# Patient Record
Sex: Female | Born: 1995 | Race: White | Hispanic: No | Marital: Single | Smoking: Never smoker
Health system: Southern US, Community
[De-identification: ages and names within clinical notes are randomized; demographics above are authoritative.]

---

## 2016-08-28 ENCOUNTER — Emergency Department
Admission: EM | Admit: 2016-08-28 | Discharge: 2016-08-28 | Disposition: A | Discharge status: Home / Self Care | Payer: BLUE CROSS/BLUE SHIELD | Attending: Emergency Medicine | Admitting: Emergency Medicine

## 2016-08-28 DIAGNOSIS — S80869A Insect bite (nonvenomous), unspecified lower leg, initial encounter: Secondary | ICD-10-CM | POA: Diagnosis present

## 2016-08-28 DIAGNOSIS — W57XXXA Bitten or stung by nonvenomous insect and other nonvenomous arthropods, initial encounter: Secondary | ICD-10-CM | POA: Insufficient documentation

## 2016-08-28 DIAGNOSIS — Y92821 Forest as the place of occurrence of the external cause: Secondary | ICD-10-CM | POA: Diagnosis not present

## 2016-08-28 DIAGNOSIS — F329 Major depressive disorder, single episode, unspecified: Secondary | ICD-10-CM

## 2016-08-28 DIAGNOSIS — Y998 Other external cause status: Secondary | ICD-10-CM | POA: Diagnosis not present

## 2016-08-28 DIAGNOSIS — F3289 Other specified depressive episodes: Secondary | ICD-10-CM | POA: Diagnosis not present

## 2016-08-28 DIAGNOSIS — Y9301 Activity, walking, marching and hiking: Secondary | ICD-10-CM | POA: Diagnosis not present

## 2016-08-28 DIAGNOSIS — F32A Depression, unspecified: Secondary | ICD-10-CM
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
## 2016-08-28 NOTE — ED Triage Notes (Signed)
Reports felt bite approximately 30 minutes prior to arrival on back of left leg and concerned might be a spider bite.

## 2016-08-28 NOTE — ED Notes (Signed)
Report to Susan, RN  

## 2016-08-28 NOTE — ED Notes (Signed)
Patient c/o 2 insect bites to lower left leg/posterior calf, and headache.  Pt is unsure if they are spider bites. Pt reports hx of reaction to spider bite in past.
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
## 2016-09-20 ENCOUNTER — Encounter: Payer: Self-pay | Admitting: Emergency Medicine

## 2016-09-20 ENCOUNTER — Emergency Department
Admission: EM | Admit: 2016-09-20 | Discharge: 2016-09-21 | Disposition: A | Discharge status: Other Acute Inpatient Hospital | Payer: BLUE CROSS/BLUE SHIELD | Attending: Emergency Medicine | Admitting: Emergency Medicine

## 2016-09-20 DIAGNOSIS — Z23 Encounter for immunization: Secondary | ICD-10-CM | POA: Insufficient documentation

## 2016-09-20 DIAGNOSIS — Z046 Encounter for general psychiatric examination, requested by authority: Secondary | ICD-10-CM | POA: Diagnosis present

## 2016-09-20 DIAGNOSIS — F22 Delusional disorders: Secondary | ICD-10-CM | POA: Insufficient documentation

## 2016-09-20 LAB — URINALYSIS, COMPLETE (UACMP) WITH MICROSCOPIC
Bilirubin Urine: NEGATIVE
GLUCOSE, UA: NEGATIVE mg/dL
Hgb urine dipstick: NEGATIVE
KETONES UR: 5 mg/dL — AB
NITRITE: NEGATIVE
PH: 5 (ref 5.0–8.0)
Protein, ur: NEGATIVE mg/dL
Specific Gravity, Urine: 1.008 (ref 1.005–1.030)

## 2016-09-20 LAB — COMPREHENSIVE METABOLIC PANEL
ALT: 18 U/L (ref 14–54)
ANION GAP: 10 (ref 5–15)
AST: 25 U/L (ref 15–41)
Albumin: 5 g/dL (ref 3.5–5.0)
Alkaline Phosphatase: 53 U/L (ref 38–126)
BUN: 13 mg/dL (ref 6–20)
CALCIUM: 9.6 mg/dL (ref 8.9–10.3)
CHLORIDE: 104 mmol/L (ref 101–111)
CO2: 24 mmol/L (ref 22–32)
Creatinine, Ser: 0.74 mg/dL (ref 0.44–1.00)
Glucose, Bld: 112 mg/dL — ABNORMAL HIGH (ref 65–99)
Potassium: 3.9 mmol/L (ref 3.5–5.1)
SODIUM: 138 mmol/L (ref 135–145)
Total Bilirubin: 1.2 mg/dL (ref 0.3–1.2)
Total Protein: 7.8 g/dL (ref 6.5–8.1)

## 2016-09-20 LAB — URINE DRUG SCREEN, QUALITATIVE (ARMC ONLY)
Amphetamines, Ur Screen: NOT DETECTED
BARBITURATES, UR SCREEN: NOT DETECTED
Benzodiazepine, Ur Scrn: NOT DETECTED
CANNABINOID 50 NG, UR ~~LOC~~: NOT DETECTED
COCAINE METABOLITE, UR ~~LOC~~: NOT DETECTED
MDMA (Ecstasy)Ur Screen: NOT DETECTED
METHADONE SCREEN, URINE: NOT DETECTED
Opiate, Ur Screen: NOT DETECTED
Phencyclidine (PCP) Ur S: NOT DETECTED
Tricyclic, Ur Screen: NOT DETECTED

## 2016-09-20 LAB — CBC
HCT: 43.6 % (ref 35.0–47.0)
HEMOGLOBIN: 14.7 g/dL (ref 12.0–16.0)
MCH: 29.8 pg (ref 26.0–34.0)
MCHC: 33.6 g/dL (ref 32.0–36.0)
MCV: 88.6 fL (ref 80.0–100.0)
PLATELETS: 264 10*3/uL (ref 150–440)
RBC: 4.93 MIL/uL (ref 3.80–5.20)
RDW: 12.7 % (ref 11.5–14.5)
WBC: 15.8 10*3/uL — ABNORMAL HIGH (ref 3.6–11.0)

## 2016-09-20 LAB — SALICYLATE LEVEL

## 2016-09-20 LAB — POCT PREGNANCY, URINE: PREG TEST UR: NEGATIVE

## 2016-09-20 LAB — ACETAMINOPHEN LEVEL

## 2016-09-20 LAB — ETHANOL

## 2016-09-20 MED ORDER — TETANUS-DIPHTH-ACELL PERTUSSIS 5-2.5-18.5 LF-MCG/0.5 IM SUSP
0.5000 mL | Freq: Once | INTRAMUSCULAR | Status: AC
  Administered 2016-09-20: 0.5 mL via INTRAMUSCULAR
  Filled 2016-09-20: qty 0.5

## 2016-09-20 MED ORDER — BACITRACIN-NEOMYCIN-POLYMYXIN 400-5-5000 EX OINT
TOPICAL_OINTMENT | Freq: Two times a day (BID) | CUTANEOUS | Status: DC
  Administered 2016-09-21: 1 via TOPICAL
  Administered 2016-09-21: via TOPICAL
  Administered 2016-09-21: 1 via TOPICAL
  Filled 2016-09-20 (×3): qty 1

## 2016-09-20 NOTE — ED Notes (Signed)
Report called from Leonette Mostharles RN to this Clinical research associatewriter. Patient to be placed in room BHU 4 after TTS does consult.
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
## 2016-09-20 NOTE — ED Notes (Signed)
.  ks
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
## 2016-09-20 NOTE — ED Notes (Signed)
PT IVC PENDING PSYCH CONSULT. 
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
## 2016-09-21 ENCOUNTER — Inpatient Hospital Stay
Admission: AD | Admit: 2016-09-21 | Discharge: 2016-10-11 | DRG: 885 | Disposition: A | Discharge status: Home / Self Care | Payer: BLUE CROSS/BLUE SHIELD | Attending: Psychiatry | Admitting: Psychiatry

## 2016-09-21 DIAGNOSIS — R110 Nausea: Secondary | ICD-10-CM | POA: Diagnosis not present

## 2016-09-21 DIAGNOSIS — G4700 Insomnia, unspecified: Secondary | ICD-10-CM | POA: Diagnosis present

## 2016-09-21 DIAGNOSIS — R064 Hyperventilation: Secondary | ICD-10-CM | POA: Diagnosis not present

## 2016-09-21 DIAGNOSIS — N390 Urinary tract infection, site not specified: Secondary | ICD-10-CM | POA: Diagnosis present

## 2016-09-21 DIAGNOSIS — Z91410 Personal history of adult physical and sexual abuse: Secondary | ICD-10-CM | POA: Diagnosis not present

## 2016-09-21 DIAGNOSIS — R55 Syncope and collapse: Secondary | ICD-10-CM | POA: Diagnosis not present

## 2016-09-21 DIAGNOSIS — Y92238 Other place in hospital as the place of occurrence of the external cause: Secondary | ICD-10-CM | POA: Diagnosis present

## 2016-09-21 DIAGNOSIS — W1789XA Other fall from one level to another, initial encounter: Secondary | ICD-10-CM | POA: Diagnosis present

## 2016-09-21 DIAGNOSIS — R45851 Suicidal ideations: Secondary | ICD-10-CM | POA: Diagnosis present

## 2016-09-21 DIAGNOSIS — F4310 Post-traumatic stress disorder, unspecified: Secondary | ICD-10-CM | POA: Diagnosis present

## 2016-09-21 DIAGNOSIS — F329 Major depressive disorder, single episode, unspecified: Secondary | ICD-10-CM | POA: Diagnosis present

## 2016-09-21 DIAGNOSIS — F22 Delusional disorders: Secondary | ICD-10-CM

## 2016-09-21 DIAGNOSIS — F29 Unspecified psychosis not due to a substance or known physiological condition: Secondary | ICD-10-CM

## 2016-09-21 DIAGNOSIS — F32A Depression, unspecified: Secondary | ICD-10-CM

## 2016-09-21 DIAGNOSIS — E538 Deficiency of other specified B group vitamins: Secondary | ICD-10-CM

## 2016-09-21 LAB — CHLAMYDIA/NGC RT PCR (ARMC ONLY)
CHLAMYDIA TR: NOT DETECTED
N GONORRHOEAE: NOT DETECTED

## 2016-09-21 LAB — WET PREP, GENITAL
Clue Cells Wet Prep HPF POC: NONE SEEN
Sperm: NONE SEEN
TRICH WET PREP: NONE SEEN
YEAST WET PREP: NONE SEEN

## 2016-09-21 MED ORDER — BACITRACIN-NEOMYCIN-POLYMYXIN 400-5-5000 EX OINT
TOPICAL_OINTMENT | Freq: Two times a day (BID) | CUTANEOUS | Status: DC
  Administered 2016-09-22 (×2): 1 via TOPICAL
  Administered 2016-09-23: 17:00:00 via TOPICAL
  Administered 2016-09-23: 1 via TOPICAL
  Administered 2016-09-24 (×2): via TOPICAL
  Administered 2016-09-27 (×2): 1 via TOPICAL
  Administered 2016-09-28: 08:00:00 via TOPICAL
  Administered 2016-09-28: 1 via TOPICAL
  Administered 2016-09-29: 10:00:00 via TOPICAL
  Filled 2016-09-21 (×16): qty 1

## 2016-09-21 MED ORDER — HYDROXYZINE HCL 25 MG PO TABS
25.0000 mg | ORAL_TABLET | Freq: Three times a day (TID) | ORAL | Status: DC | PRN
  Administered 2016-09-21: 25 mg via ORAL
  Filled 2016-09-21: qty 1

## 2016-09-21 MED ORDER — ACETAMINOPHEN 325 MG PO TABS
650.0000 mg | ORAL_TABLET | Freq: Four times a day (QID) | ORAL | Status: DC | PRN
  Administered 2016-09-25: 650 mg via ORAL
  Filled 2016-09-21: qty 2

## 2016-09-21 MED ORDER — MAGNESIUM HYDROXIDE 400 MG/5ML PO SUSP: 30.0000 mL | Freq: Every day | ORAL | Status: DC | PRN

## 2016-09-21 MED ORDER — TRAZODONE HCL 100 MG PO TABS
100.0000 mg | ORAL_TABLET | Freq: Every evening | ORAL | Status: DC | PRN
  Administered 2016-09-21: 100 mg via ORAL
  Filled 2016-09-21: qty 1

## 2016-09-21 MED ORDER — ALUM & MAG HYDROXIDE-SIMETH 200-200-20 MG/5ML PO SUSP: 30.0000 mL | ORAL | Status: DC | PRN

## 2016-09-21 NOTE — ED Notes (Signed)
Patient resting quietly in room. No noted distress or abnormal behaviors noted. Will continue 15 minute checks and observation by security camera for safety. 

## 2016-09-21 NOTE — BH Assessment (Signed)
[facility] by Dr. Toni Amendlapacs.  Attending Physician will be Dr. Ardyth HarpsHernandez.   Patient [facility] 312[facility] Rose BudGwen F.   Intake Paper Work has been signed and placed on patient chart.  ER staff is aware of the admission Monica Fischer(Glenda, ER Sect.; Dr. Lenard LancePaduchowski, ER MD; Vic RipperAmy B., Patient's Nurse & Clydie BraunKaren, Patient Access).

## 2016-09-21 NOTE — ED Notes (Signed)
PT IVC/ TO BE ADMITTED TO [facility]  

## 2016-09-21 NOTE — ED Provider Notes (Deleted)
-----------------------------------------   3:06 PM on 09/21/2016 -----------------------------------------  Patient has been seen and evaluated by psychiatry, they believe the patient is safe for discharge home from a psychiatric standpoint. Patient's medical workup has been largely nonrevealing. We will discharge the patient home   Minna AntisPaduchowski, Kynnadi Dicenso, MD 09/21/16 [phone]
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
## 2016-09-21 NOTE — ED Provider Notes (Signed)
-----------------------------------------   3:10 PM on 09/21/2016 -----------------------------------------  Patient has been seen in the [place] by psychiatry, they believe the patient requires inpatient admission for further treatment. Medical workup has been nonrevealing.   Minna AntisPaduchowski, Ngai Parcell, MD 09/21/16 1511

## 2016-09-21 NOTE — ED Notes (Signed)
Pt received lunch tray 

## 2016-09-21 NOTE — ED Notes (Signed)
Pt's pelvic exam is complete. [facility]

## 2016-09-21 NOTE — ED Notes (Signed)
EDP made aware of patient requesting pelvic exam.

## 2016-09-21 NOTE — ED Notes (Signed)
Plan to admit to [facility] BMU after 7pm 

## 2016-09-21 NOTE — ED Notes (Signed)
Correction to previous ED note. Cleaned patient right bottom foot and great toe. Neosporin and clean bandage applied. No areas of redness noted.

## 2016-09-21 NOTE — ED Notes (Signed)
Pt. Alert and oriented, warm and dry, in no distress. Pt. Denies SI, HI, and AVH. Pt. Encouraged to let nursing staff know of any concerns or needs. 

## 2016-09-21 NOTE — ED Notes (Signed)
Pt moved to room 20 for pelvic exam. Pt set up for pelvic by this RN. Awaiting MD to bedside.
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
## 2016-09-21 NOTE — ED Notes (Signed)

## 2016-09-21 NOTE — ED Notes (Signed)
[facility] Vanice SarahAbi RN. Patient aware of transfer and is agreeable with plan.

## 2016-09-21 NOTE — ED Notes (Signed)
[facility]

## 2016-09-21 NOTE — ED Notes (Signed)
Pt informed she will be transferred to BMU later tonight. Pt accepting. Pt given magazines (no staples in the binding) to read. Pt remains calm and cooperative. Maintained on 15 minute checks and observation by security camera for safety.

## 2016-09-21 NOTE — ED Notes (Signed)
Pt received breakfast tray 

## 2016-09-21 NOTE — ED Notes (Signed)
Pt to be admitted to BMU, room 312 per TTS.

## 2016-09-21 NOTE — ED Notes (Signed)
Bottom of left foot and great toe cleaned and Neosporin applied with fresh bandage. No areas of redness noted.
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
## 2016-09-21 NOTE — ED Notes (Addendum)
Dr. Clapacs at bedside. Pt is calm. Maintained on 15 minute checks and observation by security camera for safety. 

## 2016-09-21 NOTE — ED Notes (Signed)

## 2016-09-21 NOTE — ED Notes (Signed)
Pt speaking to her mother on the phone. Pt is calm. Maintained on 15 minute checks and observation by security camera for safety.

## 2016-09-22 ENCOUNTER — Encounter: Payer: Self-pay | Admitting: Psychiatry

## 2016-09-22 ENCOUNTER — Inpatient Hospital Stay: Payer: BLUE CROSS/BLUE SHIELD

## 2016-09-22 DIAGNOSIS — F22 Delusional disorders: Principal | ICD-10-CM

## 2016-09-22 LAB — LIPID PANEL
CHOL/HDL RATIO: 2.6 ratio
CHOLESTEROL: 124 mg/dL (ref 0–200)
HDL: 48 mg/dL (ref >40)
LDL Cholesterol: 65 mg/dL (ref 0–99)
TRIGLYCERIDES: 57 mg/dL (ref <150)
VLDL: 11 mg/dL (ref 0–40)

## 2016-09-22 LAB — AMMONIA

## 2016-09-22 LAB — RAPID HIV SCREEN (HIV 1/2 AB+AG)
HIV 1/2 ANTIBODIES: NONREACTIVE
HIV-1 P24 Antigen - HIV24: NONREACTIVE

## 2016-09-22 LAB — URINE CULTURE

## 2016-09-22 LAB — TSH: TSH: 0.696 u[IU]/mL (ref 0.350–4.500)

## 2016-09-22 MED ORDER — OLANZAPINE 5 MG PO TBDP
2.5000 mg | ORAL_TABLET | Freq: Every day | ORAL | Status: DC
  Filled 2016-09-22 (×2): qty 1

## 2016-09-22 NOTE — [facility] Suicide Risk Assessment (Signed)
[facility]   Nursing information obtained from:    Demographic factors:    Current Mental Status:    Loss Factors:    Historical Factors:    Risk Reduction Factors:     Total Time spent with patient: 1 hour Principal Problem: Delusional disorder (HCC) Diagnosis:   Patient Active Problem List   Diagnosis Date Noted  . Delusional disorder (HCC) [F22] 09/21/2016   Subjective Data:   Continued Clinical Symptoms:  Alcohol Use Disorder Identification Test Final Score (AUDIT): 0 The "Alcohol Use Disorders Identification Test", Guidelines for Use in Primary Care, Second Edition.  World Science writerHealth Organization [facility](WHO). Score between 0-7:  no or low risk or alcohol related problems. Score between 8-15:  moderate risk of alcohol related problems. Score between 16-19:  high risk of alcohol related problems. Score 20 or above:  warrants further diagnostic evaluation for alcohol dependence and treatment.   CLINICAL FACTORS:   Severe Anxiety and/or Agitation Currently Psychotic    Psychiatric Specialty Exam: Physical Exam  ROS  Blood pressure (!) 102/58, pulse 88, temperature 98.6 F (37 C), temperature source Oral, resp. rate 18, height 5\' 7"  (1.702 m), weight 55.8 kg (123 lb), last menstrual period 08/31/2016, SpO2 100 %.Body mass index is 19.26 kg/m.                                                    Sleep:  Number of Hours: 5.3      COGNITIVE FEATURES THAT CONTRIBUTE TO RISK:  Closed-mindedness    SUICIDE RISK:   Moderate:  Frequent suicidal ideation with limited intensity, and duration, some specificity in terms of plans, no associated intent, good self-control, limited dysphoria/symptomatology, some risk factors present, and identifiable protective factors, including available and accessible social support.  PLAN OF CARE: admit  I certify that inpatient services furnished can reasonably be expected to improve the patient's  condition.   Jimmy FootmanHernandez-Gonzalez,  Dazia Lippold, MD 09/22/2016, 12:31 PM

## 2016-09-22 NOTE — Progress Notes (Signed)
   09/22/16 0155  Clinical Encounter Type  Visited With Patient  Visit Type Initial;Behavioral Health;Spiritual support  Referral From Nurse  Spiritual Encounters  Spiritual Needs Prayer   [facility]. Patient wanted prayer. CH offered emotional support and prayer. Patient stated she was not ready to talk. CH invited patient to attend group outside at 2pm.

## 2016-09-22 NOTE — [facility] Suicide Risk Assessment (Signed)
[facility]   Nursing information obtained from:    Demographic factors:    Current Mental Status:    Loss Factors:    Historical Factors:    Risk Reduction Factors:     Total Time spent with patient: 1 hour Principal Problem: Delusional disorder (HCC) Diagnosis:   Patient Active Problem List   Diagnosis Date Noted  . Delusional disorder (HCC) [F22] 09/21/2016   Subjective Data:   Continued Clinical Symptoms:  Alcohol Use Disorder Identification Test Final Score (AUDIT): 0 The "Alcohol Use Disorders Identification Test", Guidelines for Use in Primary Care, Second Edition.  World Science writerHealth Organization [facility](WHO). Score between 0-7:  no or low risk or alcohol related problems. Score between 8-15:  moderate risk of alcohol related problems. Score between 16-19:  high risk of alcohol related problems. Score 20 or above:  warrants further diagnostic evaluation for alcohol dependence and treatment.   CLINICAL FACTORS:   Severe Anxiety and/or Agitation Currently Psychotic   Musculoskeletal:   Psychiatric Specialty Exam: Physical Exam  ROS  Blood pressure (!) 102/58, pulse 88, temperature 98.6 F (37 C), temperature source Oral, resp. rate 18, height 5\' 7"  (1.702 m), weight 55.8 kg (123 lb), last menstrual period 08/31/2016, SpO2 100 %.Body mass index is 19.26 kg/m.                                                    Sleep:  Number of Hours: 5.3      COGNITIVE FEATURES THAT CONTRIBUTE TO RISK:  Closed-mindedness    SUICIDE RISK:   Moderate:  Frequent suicidal ideation with limited intensity, and duration, some specificity in terms of plans, no associated intent, good self-control, limited dysphoria/symptomatology, some risk factors present, and identifiable protective factors, including available and accessible social support.  PLAN OF CARE: admit  I certify that inpatient services furnished can reasonably be expected to  improve the patient's condition.   Jimmy FootmanHernandez-Gonzalez,  Pahola Dimmitt, MD 09/22/2016, 10:41 AM

## 2016-09-22 NOTE — Progress Notes (Signed)
   09/22/16 0215  Clinical Encounter Type  Visited With Patient  Visit Type Follow-up;Spiritual support (Group)  Referral From Chaplain  Spiritual Encounters  Spiritual Needs Prayer;Emotional   [facility] Patient stated she is having a bad day. Patient goal for things to get better.

## 2016-09-22 NOTE — Progress Notes (Signed)
Pt with low blood pressure this morning, appears sleepy/lethargic. Provided and encouraged fluids. Gait steady, but pt does reports feeling light headed and dizzy. Pt voices she feels the medications she was given last night affected her blood pressure. Educated on fall safety, verbalized understanding. Speech soft, logical/coherant. Forwards little. Poor eye contact, avoidant although pleasant and cooperative. Bandage changed, neosporin applied to bottom of right great toe laceration. Support and encouragement provided. Safety maintained with every 15 minute checks. Will continue to monitor.
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
## 2016-09-22 NOTE — Tx Team (Signed)
Initial Treatment Plan 09/22/2016 1:41 AM Tamsen MeekGreta Courser [id number]    PATIENT STRESSORS: Traumatic event Other: Fixed Delusional Belief: "I think I am confused, my mind is playing tricks on me.."   PATIENT STRENGTHS: Ability for insight Average or above average intelligence Capable of independent living Motivation for treatment/growth Supportive family/friends   PATIENT IDENTIFIED PROBLEMS: Mood Instability  PTSD  Paranoia                 DISCHARGE CRITERIA:  Improved stabilization in mood, thinking, and/or behavior Motivation to continue treatment in a less acute level of care Reduction of life-threatening or endangering symptoms to within safe limits Verbal commitment to aftercare and medication compliance  PRELIMINARY DISCHARGE PLAN: Outpatient therapy Return to previous living arrangement  PATIENT/FAMILY INVOLVEMENT: This treatment plan has been presented to and reviewed with the patient, Tamsen MeekGreta Estrin.  The patient and family have been given the opportunity to ask questions and make suggestions.  Cleotis NipperAbiodun T Deryn Massengale, RN 09/22/2016, 1:41 AM

## 2016-09-22 NOTE — [facility] Group Notes (Signed)
Goals Group  Date/Time: 09/22/2016, 9:00 AM Type of Therapy and Topic: Group Therapy: Goals Group: SMART Goals  ?  Participation Level: Moderate  ?  Description of Group:  ?  The purpose of a daily goals group is to assist and guide patients in setting recovery/wellness-related goals. The objective is to set goals as they relate to the crisis in which they were admitted. Patients will be using SMART goal modalities to set measurable goals. Characteristics of realistic goals will be discussed and patients will be assisted in setting and processing how one will reach their goal. Facilitator will also assist patients in applying interventions and coping skills learned in psycho-education groups to the SMART goal and process how one will achieve defined goal.  ?  Therapeutic Goals:  ?  -Patients will develop and document one goal related to or their crisis in which brought them into treatment.  -Patients will be guided by LCSW using SMART goal setting modality in how to set a measurable, attainable, realistic and time sensitive goal.  -Patients will process barriers in reaching goal.  -Patients will process interventions in how to overcome and successful in reaching goal.  ?  Patient's Goal:  Pt stated that her goal "to relax and be mindful." ?  Therapeutic Modalities:  Motivational Interviewing  Cognitive Behavioral Therapy  Crisis Intervention Model  SMART goals setting   Hampton AbbotKadijah Turkessa Ostrom, MSW, LCSW-A 09/22/2016, 10:18AM

## 2016-09-22 NOTE — Progress Notes (Signed)
Admission Note:  Patient's skin was assessed and found to be warm to touch, free of tattoos, in addition patient was noted to have a skin laceration under her big right toe, no redness, no drainage or foul smell noted dressing is intact and patient is able to wiggle toe, pedal pulse was palpable on the right leg. Patient was also searched thoroughly and no contraband found. Food and fluids offered, and fluids accepted. Pt had no additional questions or concerns, will continue to monitor.

## 2016-09-22 NOTE — Plan of Care (Signed)
Problem: Physical Regulation: Goal: Ability to maintain clinical measurements within normal limits will improve Outcome: Progressing Pt's blood pressure low, but improved with encouraged fluids. Educated on fall safety, assistance provided. Safety maintained. Will continue to monitor.  Problem: Safety: Goal: Periods of time without injury will increase Outcome: Progressing Free from injury. Dressing applied to injured right foot which was sustained prior to admission. Safety maintained with every 15 minute checks. Will continue to monitor.
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
## 2016-09-22 NOTE — Plan of Care (Signed)
Problem: Activity: Goal: Sleeping patterns will improve Outcome: Progressing Patient slept for Estimated Hours of 5.30; Precautionary checks every 15 minutes for safety maintained, room free of safety hazards, patient sustains no injury or falls during this shift.  Hypotensive this morning, fluids pushed: 2 Gatorade given, Oncoming RN will follow up, review future MAR.

## 2016-09-23 LAB — HEMOGLOBIN A1C
Hgb A1c MFr Bld: 4.8 % (ref 4.8–5.6)
MEAN PLASMA GLUCOSE: 91 mg/dL

## 2016-09-23 LAB — RPR: RPR Ser Ql: NONREACTIVE

## 2016-09-23 NOTE — [facility] Group Notes (Signed)
[facility] Group Notes:  (Nursing/MHT/Case Management/Adjunct)  Date:  09/23/2016  Time:  12:20 AM  Type of Therapy:  Psychoeducational Skills  Participation Level:  Active  Participation Quality:  Appropriate, Attentive and Sharing  Affect:  Appropriate  Cognitive:  Oriented  Insight:  Good  Engagement in Group:  Developing/Improving and Engaged  Modes of Intervention:  Clarification and Discussion  Summary of Progress/Problems:  Sashay Felling R Nalah Macioce 09/23/2016, 12:20 AM

## 2016-09-23 NOTE — Tx Team (Signed)
Interdisciplinary Treatment and Diagnostic Plan Update  09/23/2016 Time of Session: 1050 Monica Fischer MRN: [id number]  Principal Diagnosis: Delusional disorder [facility])  Secondary Diagnoses: Principal Problem:   Delusional disorder (HCC)   Current Medications:  Current Facility-Administered Medications  Medication Dose Route Frequency Provider Last Rate Last Dose  . acetaminophen (TYLENOL) tablet 650 mg  650 mg Oral Q6H PRN Clapacs, John T, MD      . alum & mag hydroxide-simeth (MAALOX/MYLANTA) 200-200-20 MG/5ML suspension 30 mL  30 mL Oral Q4H PRN Clapacs, John T, MD      . magnesium hydroxide (MILK OF MAGNESIA) suspension 30 mL  30 mL Oral Daily PRN Clapacs, Jackquline Denmark, MD      . neomycin-bacitracin-polymyxin (NEOSPORIN) ointment   Topical BID Clapacs, Jackquline Denmark, MD   1 application at 09/23/16 [phone]  . OLANZapine zydis (ZYPREXA) disintegrating tablet 2.5 mg  2.5 mg Oral QHS Jimmy Footman, MD       PTA Medications: No prescriptions prior to admission.    Patient Stressors: Traumatic event Other: Fixed Delusional Belief: "I think I am confused, my mind is playing tricks on me.."  Patient Strengths: Ability for insight Average or above average intelligence Capable of independent living Motivation for treatment/growth Supportive family/friends  Treatment Modalities: Medication Management, Group therapy, Case management,  1 to 1 session with clinician, Psychoeducation, Recreational therapy.   Physician Treatment Plan for Primary Diagnosis: Delusional disorder [facility]) Long Term Goal(s): Improvement in symptoms so as ready for discharge Improvement in symptoms so as ready for discharge   Short Term Goals: Ability to identify changes in lifestyle to reduce recurrence of condition will improve Ability to verbalize feelings will improve Ability to demonstrate self-control will improve Ability to identify and develop effective coping behaviors will improve Ability to identify  triggers associated with substance abuse/mental health issues will improve  Medication Management: Evaluate patient's response, side effects, and tolerance of medication regimen.  Therapeutic Interventions: 1 to 1 sessions, Unit Group sessions and Medication administration.  Evaluation of Outcomes: Not Progressing  Physician Treatment Plan for Secondary Diagnosis: Principal Problem:   Delusional disorder (HCC)  Long Term Goal(s): Improvement in symptoms so as ready for discharge Improvement in symptoms so as ready for discharge   Short Term Goals: Ability to identify changes in lifestyle to reduce recurrence of condition will improve Ability to verbalize feelings will improve Ability to demonstrate self-control will improve Ability to identify and develop effective coping behaviors will improve Ability to identify triggers associated with substance abuse/mental health issues will improve     Medication Management: Evaluate patient's response, side effects, and tolerance of medication regimen.  Therapeutic Interventions: 1 to 1 sessions, Unit Group sessions and Medication administration.  Evaluation of Outcomes: Not Progressing   RN Treatment Plan for Primary Diagnosis: Delusional disorder (HCC) Long Term Goal(s): Knowledge of disease and therapeutic regimen to maintain health will improve  Short Term Goals: Ability to identify and develop effective coping behaviors will improve and Compliance with prescribed medications will improve  Medication Management: RN will administer medications as ordered by provider, will assess and evaluate patient's response and provide education to patient for prescribed medication. RN will report any adverse and/or side effects to prescribing provider.  Therapeutic Interventions: 1 on 1 counseling sessions, Psychoeducation, Medication administration, Evaluate responses to treatment, Monitor vital signs and CBGs as ordered, Perform/monitor CIWA, COWS,  AIMS and Fall Risk screenings as ordered, Perform wound care treatments as ordered.  Evaluation of Outcomes: Not Progressing   LCSW Treatment  Plan for Primary Diagnosis: Delusional disorder [facility](HCC) Long Term Goal(s): Safe transition to appropriate next level of care at discharge, Engage patient in therapeutic group addressing interpersonal concerns.  Short Term Goals: Engage patient in aftercare planning with referrals and resources, Increase social support, Facilitate acceptance of mental health diagnosis and concerns and Increase skills for wellness and recovery  Therapeutic Interventions: Assess for all discharge needs, 1 to 1 time with Social worker, Explore available resources and support systems, Assess for adequacy in community support network, Educate family and significant other(s) on suicide prevention, Complete Psychosocial Assessment, Interpersonal group therapy.  Evaluation of Outcomes: Not Progressing   Progress in Treatment: Attending groups: Yes. Participating in groups: Yes. Taking medication as prescribed: No. Toleration medication: Yes. Family/Significant other contact made: No, will contact:  when given permission Patient understands diagnosis: No. Discussing patient identified problems/goals with staff: Yes. Medical problems stabilized or resolved: Yes. Denies suicidal/homicidal ideation: Yes. Issues/concerns per patient self-inventory: No. Other: none  New problem(s) identified: No, Describe:  none  New Short Term/Long Term Goal(s): Pt goal: "I don't really know."  Discharge Plan or Barriers: CSW assessing for appropriate plan.  Reason for Continuation of Hospitalization: Delusions  Medication stabilization  Estimated Length of Stay: 5-7 days.  Attendees: Patient:Monica Fischer 09/23/2016   Physician: Dr. Ardyth HarpsHernandez, MD 09/23/2016   Nursing: Leonia ReaderPhyllis Cobb, RN 09/23/2016   RN Care Manager: 09/23/2016   Social Worker: Daleen SquibbGreg Roseanna Koplin, LCSW 09/23/2016   Recreational  Therapist:  09/23/2016   Other:  09/23/2016   Other:  09/23/2016  Other: 09/23/2016      Scribe for Treatment Team: Lorri FrederickWierda, Oniya Mandarino Jon, LCSW 09/23/2016 1:21 PM
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
## 2016-09-23 NOTE — Progress Notes (Signed)
Pt calm, cooperative and pleasant this morning.  Soft-spoken and guarded, minimal interaction observed with staff or peers.  Neosporin only scheduled med this morning.  Pt reports she wants to talk to MD about zyprexa.  Does not engage in assessment, does not describe events leading up to admission, eager to end discussion.

## 2016-09-23 NOTE — [facility] Group Notes (Signed)
[facility] LCSW Group Therapy  09/23/2016 1:35 PM  Type of Therapy:  Group Therapy  Participation Level:  Minimal   Participation Quality:  Appropriate  Affect:  Flat  Cognitive:  Alert  Insight:  Limited  Engagement in Therapy:  Limited  Modes of Intervention:  Activity, Discussion, Education, Socialization and Support  Summary of Progress/Problems: Feelings around Relapse. Group members discussed the meaning of relapse and shared personal stories of relapse, how it affected them and others, and how they perceived themselves during this time. Group members were encouraged to identify triggers, warning signs and coping skills used when facing the possibility of relapse. Social supports were discussed and explored in detail. Pt attended group and stayed the entire time. Pt sat quietly and listened to other group members.   Chanae Gemma L Salome Hautala MSW, LCSW  09/23/2016, 1:35 PM     

## 2016-09-23 NOTE — Progress Notes (Signed)
Pt appeared to sleep for about 7 hours while monitored on 15 minute safety checks.

## 2016-09-23 NOTE — Plan of Care (Signed)
Problem: Health Behavior/Discharge Planning: Goal: Compliance with treatment plan for underlying cause of condition will improve Outcome: Not Progressing No improvement in insight, continues to refuse meds
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
## 2016-09-24 LAB — CBC WITH DIFFERENTIAL/PLATELET
BASOS ABS: 0 10*3/uL (ref 0–0.1)
Basophils Relative: 0 %
EOS PCT: 1 %
Eosinophils Absolute: 0.1 10*3/uL (ref 0–0.7)
HEMATOCRIT: 43.2 % (ref 35.0–47.0)
Hemoglobin: 14.7 g/dL (ref 12.0–16.0)
LYMPHS PCT: 15 %
Lymphs Abs: 1.6 10*3/uL (ref 1.0–3.6)
MCH: 30.2 pg (ref 26.0–34.0)
MCHC: 34.1 g/dL (ref 32.0–36.0)
MCV: 88.7 fL (ref 80.0–100.0)
MONO ABS: 0.7 10*3/uL (ref 0.2–0.9)
MONOS PCT: 6 %
NEUTROS ABS: 8.3 10*3/uL — AB (ref 1.4–6.5)
Neutrophils Relative %: 78 %
PLATELETS: 240 10*3/uL (ref 150–440)
RBC: 4.87 MIL/uL (ref 3.80–5.20)
RDW: 12.7 % (ref 11.5–14.5)
WBC: 10.7 10*3/uL (ref 3.6–11.0)

## 2016-09-24 LAB — URINALYSIS, COMPLETE (UACMP) WITH MICROSCOPIC
BILIRUBIN URINE: NEGATIVE
GLUCOSE, UA: 50 mg/dL — AB
Hgb urine dipstick: NEGATIVE
KETONES UR: NEGATIVE mg/dL
NITRITE: NEGATIVE
PH: 7 (ref 5.0–8.0)
Protein, ur: NEGATIVE mg/dL
SPECIFIC GRAVITY, URINE: 1.005 (ref 1.005–1.030)

## 2016-09-24 MED ORDER — OLANZAPINE 5 MG PO TBDP
2.5000 mg | ORAL_TABLET | Freq: Every day | ORAL | Status: DC
  Administered 2016-09-24 – 2016-09-26 (×3): 2.5 mg via ORAL
  Filled 2016-09-24 (×2): qty 1

## 2016-09-24 MED ORDER — OLANZAPINE 5 MG PO TBDP: 5.0000 mg | ORAL_TABLET | Freq: Every day | ORAL | Status: DC

## 2016-09-24 NOTE — [facility] Group Notes (Signed)
[facility] LCSW Group Therapy  09/24/2016 1:55 PM  Type of Therapy:  Group Therapy  Participation Level:  Minimal  Participation Quality:  Attentive  Affect:  Flat  Cognitive:  Alert  Insight:  Limited  Engagement in Therapy:  Limited  Modes of Intervention:  Activity, Discussion, Education, Problem-solving, Dance movement psychotherapisteality Testing, Socialization and Support  Summary of Progress/Problems: Stress management: Patients defined and discussed the topic of stress and the related symptoms and triggers for stress. Patients identified healthy coping skills they would like to try during hospitalization and after discharge to manage stress in a healthy way. CSW offered insight to varying stress management techniques.   Rodman Recupero G. Garnette CzechSampson MSW, LCSWA 09/24/2016, 1:58 PM

## 2016-09-24 NOTE — Progress Notes (Signed)
Pt remains guarded, frequently isolative.  Visible on the periphery of unit, minimal interaction observed.  Monosyllabic, vague answers to assessment questions.  Attempts to end discussion as soon as possible, casually walks away.  Met with MD and verbalized intent to accept scheduled zyprexa tonight.  Will continue to encourage and support this.  Urine specimen collected and sent.  No behavioral issues.  Later in shift, affect brighten mildly, smiled a couple of times in interactions.  Pts boyfriend visited, which pt was happy about.

## 2016-09-24 NOTE — Plan of Care (Signed)
Problem: Activity: Goal: Interest or engagement in activities will improve Outcome: Progressing Improved participation in group activities
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
## 2016-09-24 NOTE — [facility] Group Notes (Signed)
[facility] Group Notes:  (Nursing/MHT/Case Management/Adjunct)  Date:  09/24/2016  Time:  6:23 AM  Type of Therapy:  Psychoeducational Skills  Participation Level:  Active  Participation Quality:  Appropriate, Attentive and Sharing  Affect:  Appropriate  Cognitive:  Appropriate  Insight:  Appropriate and Good  Engagement in Group:  Engaged  Modes of Intervention:  Discussion, Socialization and Support  Summary of Progress/Problems:  Chancy MilroyLaquanda Y Jakyiah Briones 09/24/2016, 6:23 AM

## 2016-09-24 NOTE — Plan of Care (Signed)
Problem: Activity: Goal: Interest or engagement in activities will improve Outcome: Progressing Patient was observed visiting with a friend and was polite and calm when introduced to Clinical research associatewriter.  She attended wrap up group and was appropriate.    Problem: Safety: Goal: Periods of time without injury will increase Outcome: Progressing Patient was appropriate while engaging with peers and staff.  She did not voice delusional content as witnessed by staff. She assertively refused Zyprexa during medication pass and stated she "does not normally take any medications".

## 2016-09-25 MED ORDER — IBUPROFEN 600 MG PO TABS
600.0000 mg | ORAL_TABLET | Freq: Four times a day (QID) | ORAL | Status: DC | PRN
  Administered 2016-09-25: 600 mg via ORAL
  Filled 2016-09-25: qty 1

## 2016-09-25 NOTE — [facility] Group Notes (Signed)
[facility] Group Notes:  (Nursing/MHT/Case Management/Adjunct)  Date:  09/25/2016  Time:  5:03 AM  Type of Therapy:  Psychoeducational Skills  Participation Level:  Active  Participation Quality:  Appropriate, Attentive and Sharing  Affect:  Appropriate  Cognitive:  Appropriate  Insight:  Appropriate and Good  Engagement in Group:  Engaged  Modes of Intervention:  Discussion, Socialization and Support  Summary of Progress/Problems:  Monica Fischer 09/25/2016, 5:03 AM

## 2016-09-25 NOTE — Plan of Care (Signed)
Problem: Education: Goal: Knowledge of [facility] Education information/materials will improve Outcome: Progressing Patient was given medication education on Zyprexa.  She indicated understanding but interacted minimally.  Problem: Safety: Goal: Periods of time without injury will increase Outcome: Progressing Patient spent brief periods in her room and spent time with peers in the dayroom.  She attended group and had snack in the dayroom.  She was encouraged to come to the medication room and accepted the Zyprexa 2.5 mg.  She stated she slept well last night.

## 2016-09-25 NOTE — Plan of Care (Signed)
Problem: Safety: Goal: Periods of time without injury will increase Outcome: Progressing Patient remains safe and without injury during hospitalization and on Q 15 minute observations. Will continue to monitor patient.   
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
## 2016-09-25 NOTE — [facility] Group Notes (Signed)
[facility] Group Notes:  (Clinical Social Work)  09/25/2016  1:00-2:00PM  Summary of Progress/Problems:  The main focus of today's process group was to listen to a variety of genres of music and to identify that different types of music provoke different responses.  The patient then was able to identify personally what was soothing for them, as well as energizing, as well as how patient can personally use this knowledge in sleep habits, with depression, and with other symptoms.  The patient expressed at the beginning of group the overall feeling of "good" and was attentive but not demonstrative during group.  She said several songs made her feel calm and relaxed.  At the end of group she stated she felt "happier."  Type of Therapy:  Music Therapy   Participation Level:  Active  Participation Quality:  Attentive   Affect:  Blunted and Depressed  Cognitive:  Oriented  Insight:  Improving  Engagement in Therapy:  Engaged  Modes of Intervention:   Activity, Exploration  Ambrose MantleMareida Grossman-Orr, LCSW 09/25/2016

## 2016-09-25 NOTE — Progress Notes (Signed)
Patient was visible on the unit interacting with peers and watching TV in the dayroom. Patient attend unit group and activities. Patient took a shower this afternoon and shaved. Patient denies any SI/HI/AH/VH. Patient is alert and oriented x 4, breathing unlabored, and extremities x 4 within normal limits. Patient is calm and cooperative. Patient did not display any disruptive behavior. Patient continues to be monitored on 15 minute safety checks. Will continue to monitor patient and notify MD of any changes.

## 2016-09-26 LAB — METHYLMALONIC ACID, SERUM: Methylmalonic Acid, Quantitative: 100 nmol/L (ref 0–378)

## 2016-09-26 NOTE — Progress Notes (Signed)
Patient presents with sad, flat affect. Isolative to self and room. Forwards minimal information. Denies SI, HI, AVH. Med and group compliant. Appropriate with staff and peers.  Encouragement and support offered. Safety checks maintained. Pt receptive and remains safe on unit with q 15 min checks.
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
## 2016-09-26 NOTE — Plan of Care (Signed)
Problem: Activity: Goal: Interest or engagement in activities will improve Outcome: Progressing Patient was observed watching movie in the dayroom with peers during snack.  She later was observed reading in her room.    Problem: Coping: Goal: Ability to verbalize frustrations and anger appropriately will improve Outcome: Progressing Patient was observed socializing with BF during visiting hours.  She later approached writer "to take my medication".   She remains superficial but did engage with Clinical research associatewriter in a pleasant conversation about Grandmothers and her birthday today.  Eye contact is improved and behavior is calm.
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
## 2016-09-27 MED ORDER — OLANZAPINE 5 MG PO TBDP
5.0000 mg | ORAL_TABLET | Freq: Every day | ORAL | Status: DC
  Administered 2016-09-27 – 2016-09-28 (×2): 5 mg via ORAL
  Filled 2016-09-27 (×2): qty 1

## 2016-09-27 NOTE — Plan of Care (Signed)
Problem: Safety: Goal: Ability to remain free from injury will improve Outcome: Progressing Patient remains free from injury at this time.  Problem: Education: Goal: Emotional status will improve Outcome: Progressing Patient's emotional status has improved since date of admission Goal: Verbalization of understanding the information provided will improve Outcome: Progressing Patient understands the information provided.  Problem: Coping: Goal: Ability to verbalize frustrations and anger appropriately will improve Outcome: Progressing Patient does not verbalize any frustrations nor anger when communicating with staff.
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
## 2016-09-27 NOTE — [facility] Group Notes (Signed)
Goals Group  Date/Time: 09/27/2016, 9:00 AM Type of Therapy and Topic: Group Therapy: Goals Group: SMART Goals  ?  Participation Level: Moderate  ?  Description of Group:  ?  The purpose of a daily goals group is to assist and guide patients in setting recovery/wellness-related goals. The objective is to set goals as they relate to the crisis in which they were admitted. Patients will be using SMART goal modalities to set measurable goals. Characteristics of realistic goals will be discussed and patients will be assisted in setting and processing how one will reach their goal. Facilitator will also assist patients in applying interventions and coping skills learned in psycho-education groups to the SMART goal and process how one will achieve defined goal.  ?  Therapeutic Goals:  ?  -Patients will develop and document one goal related to or their crisis in which brought them into treatment.  -Patients will be guided by LCSW using SMART goal setting modality in how to set a measurable, attainable, realistic and time sensitive goal.  -Patients will process barriers in reaching goal.  -Patients will process interventions in how to overcome and successful in reaching goal.  ?  Patient's Goal: Patient stated that her goal is to "exercise." ?  Therapeutic Modalities:  Motivational Interviewing  Cognitive Behavioral Therapy  Crisis Intervention Model  SMART goals setting   Hampton AbbotKadijah Shanine Kreiger, MSW, LCSW-A 09/27/2016, 10:39AM

## 2016-09-27 NOTE — Plan of Care (Signed)
Problem: Activity: Goal: Sleeping patterns will improve Outcome: Progressing Patient slept for Estimated Hours of 7.45; Precautionary checks every 15 minutes for safety maintained, room free of safety hazards, patient sustains no injury or falls during this shift.     

## 2016-09-27 NOTE — Progress Notes (Signed)
Patient ID: Tamsen MeekGreta Fischer, female   DOB: [birth year], 21 y.o.   MRN: [id number] Improved thoughts, contents and perceptions; denied pain, denied AV/H, no odd and bizarre behaviors; visitation by BF, Marcello MooresIsaac, went well, complied with medication at bedtime.

## 2016-09-27 NOTE — [facility] Group Notes (Signed)
[facility] LCSW Group Therapy Note  Date/Time: 09/27/2016, 3:00PM  Type of Therapy/Topic:  Group Therapy:  Feelings about Diagnosis  Participation Level:  Minimal   Mood: Appropriate    Description of Group:    This group will allow patients to explore their thoughts and feelings about diagnoses they have received. Patients will be guided to explore their level of understanding and acceptance of these diagnoses. Facilitator will encourage patients to process their thoughts and feelings about the reactions of others to their diagnosis, and will guide patients in identifying ways to discuss their diagnosis with significant others in their lives. This group will be process-oriented, with patients participating in exploration of their own experiences as well as giving and receiving support and challenge from other group members.   Therapeutic Goals: 1. Patient will demonstrate understanding of diagnosis as evidence by identifying two or more symptoms of the disorder:  2. Patient will be able to express two feelings regarding the diagnosis 3. Patient will demonstrate ability to communicate their needs through discussion and/or role plays  Summary of Patient Progress: Patient stated feelings around mental health diagnosis. Patient stated what it means to have a mental health diagnosis and ways to effectively cope with it. Patient identified two current feelings. CSW provided support to patient.    Therapeutic Modalities:   Cognitive Behavioral Therapy Brief Therapy Feelings Identification   Hampton AbbotKadijah Yesmin Fischer, MSW, LCSW-A 09/27/2016, 3:43PM

## 2016-09-27 NOTE — Progress Notes (Signed)
Patient is alert and oriented to person, place and time. Skin is warm, dry and intact. No limitations to all four extremities noted. Patient denies SI at this time. Patient transported to court this morning for her hearing per  [facility]. Affect appropriate to situation. Patient was observed ambulating in hall during the shift with a steady gait. Attends meals and group with peer appropriate interaction noted. Milieu remains therapeutic. Patient will be monitored and physician notified of any acute changes.
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
## 2016-09-28 MED ORDER — CYANOCOBALAMIN 1000 MCG/ML IJ SOLN: 1000.0000 ug | Freq: Every day | INTRAMUSCULAR | Status: DC

## 2016-09-28 MED ORDER — CYANOCOBALAMIN 1000 MCG/ML IJ SOLN
1000.0000 ug | Freq: Every day | INTRAMUSCULAR | Status: DC
  Administered 2016-09-28 – 2016-10-04 (×6): 1000 ug via SUBCUTANEOUS
  Filled 2016-09-28 (×6): qty 1

## 2016-09-28 NOTE — Progress Notes (Signed)
   09/28/16 1330  Clinical Encounter Type  Visited With Patient;Other (Comment)  Visit Type Spiritual support;Behavioral Health  Referral From Chaplain  Spiritual Encounters  Spiritual Needs Emotional;Grief support;Prayer   Patient attended afternoon group. Patient stated she feels misunderstood by others due to the labels of mental illness. Patient would like to work on understanding how to manage her feelings and find healthier outlets to dealing with her emotions. Patient stated she made a poor decision and does not want to continue in that cycle. Chaplain will follow-up with patient to assist her in completing her goal.

## 2016-09-28 NOTE — Plan of Care (Signed)
Problem: Activity: Goal: Sleeping patterns will improve Outcome: Progressing Patient slept for Estimated Hours of 6.30; Precautionary checks every 15 minutes for safety maintained, room free of safety hazards, patient sustains no injury or falls during this shift.    

## 2016-09-28 NOTE — Tx Team (Signed)
Interdisciplinary Treatment and Diagnostic Plan Update  09/28/2016 Time of Session: 1230 Monica Fischer MRN: [id number]  Principal Diagnosis: Delusional disorder [facility](HCC)  Secondary Diagnoses: Principal Problem:   Delusional disorder (HCC)   Current Medications:  Current Facility-Administered Medications  Medication Dose Route Frequency Provider Last Rate Last Dose  . acetaminophen (TYLENOL) tablet 650 mg  650 mg Oral Q6H PRN Clapacs, Jackquline DenmarkJohn T, MD   650 mg at 09/25/16 1419  . alum & mag hydroxide-simeth (MAALOX/MYLANTA) 200-200-20 MG/5ML suspension 30 mL  30 mL Oral Q4H PRN Clapacs, John T, MD      . cyanocobalamin ((VITAMIN B-12)) injection 1,000 mcg  1,000 mcg Subcutaneous Daily Hernandez-Gonzalez, Sue LushAndrea, MD      . ibuprofen (ADVIL,MOTRIN) tablet 600 mg  600 mg Oral Q6H PRN Darliss RidgelKapur, Aarti K, MD   600 mg at 09/25/16 2243  . magnesium hydroxide (MILK OF MAGNESIA) suspension 30 mL  30 mL Oral Daily PRN Clapacs, John T, MD      . neomycin-bacitracin-polymyxin (NEOSPORIN) ointment   Topical BID Clapacs, John T, MD      . OLANZapine zydis (ZYPREXA) disintegrating tablet 5 mg  5 mg Oral QHS Jimmy FootmanHernandez-Gonzalez, Andrea, MD   5 mg at 09/27/16 2103   PTA Medications: No prescriptions prior to admission.    Patient Stressors: Traumatic event Other: Fixed Delusional Belief: "I think I am confused, my mind is playing tricks on me.."  Patient Strengths: Ability for insight Average or above average intelligence Capable of independent living Motivation for treatment/growth Supportive family/friends  Treatment Modalities: Medication Management, Group therapy, Case management,  1 to 1 session with clinician, Psychoeducation, Recreational therapy.   Physician Treatment Plan for Primary Diagnosis: Delusional disorder [facility](HCC) Long Term Goal(s): Improvement in symptoms so as ready for discharge Improvement in symptoms so as ready for discharge   Short Term Goals: Ability to identify changes in lifestyle  to reduce recurrence of condition will improve Ability to verbalize feelings will improve Ability to demonstrate self-control will improve Ability to identify and develop effective coping behaviors will improve Ability to identify triggers associated with substance abuse/mental health issues will improve  Medication Management: Evaluate patient's response, side effects, and tolerance of medication regimen.  Therapeutic Interventions: 1 to 1 sessions, Unit Group sessions and Medication administration.  Evaluation of Outcomes: Progressing  Physician Treatment Plan for Secondary Diagnosis: Principal Problem:   Delusional disorder (HCC)  Long Term Goal(s): Improvement in symptoms so as ready for discharge Improvement in symptoms so as ready for discharge   Short Term Goals: Ability to identify changes in lifestyle to reduce recurrence of condition will improve Ability to verbalize feelings will improve Ability to demonstrate self-control will improve Ability to identify and develop effective coping behaviors will improve Ability to identify triggers associated with substance abuse/mental health issues will improve     Medication Management: Evaluate patient's response, side effects, and tolerance of medication regimen.  Therapeutic Interventions: 1 to 1 sessions, Unit Group sessions and Medication administration.  Evaluation of Outcomes: Progressing   RN Treatment Plan for Primary Diagnosis: Delusional disorder (HCC) Long Term Goal(s): Knowledge of disease and therapeutic regimen to maintain health will improve  Short Term Goals: Ability to identify and develop effective coping behaviors will improve and Compliance with prescribed medications will improve  Medication Management: RN will administer medications as ordered by provider, will assess and evaluate patient's response and provide education to patient for prescribed medication. RN will report any adverse and/or side effects to  prescribing provider.  Therapeutic Interventions:  1 on 1 counseling sessions, Psychoeducation, Medication administration, Evaluate responses to treatment, Monitor vital signs and CBGs as ordered, Perform/monitor CIWA, COWS, AIMS and Fall Risk screenings as ordered, Perform wound care treatments as ordered.  Evaluation of Outcomes: Progressing   LCSW Treatment Plan for Primary Diagnosis: Delusional disorder [facility](HCC) Long Term Goal(s): Safe transition to appropriate next level of care at discharge, Engage patient in therapeutic group addressing interpersonal concerns.  Short Term Goals: Engage patient in aftercare planning with referrals and resources, Increase social support, Facilitate acceptance of mental health diagnosis and concerns and Increase skills for wellness and recovery  Therapeutic Interventions: Assess for all discharge needs, 1 to 1 time with Social worker, Explore available resources and support systems, Assess for adequacy in community support network, Educate family and significant other(s) on suicide prevention, Complete Psychosocial Assessment, Interpersonal group therapy.  Evaluation of Outcomes: Progressing   Progress in Treatment: Attending groups: Yes. Participating in groups: Yes. Taking medication as prescribed: No. Toleration medication: Yes. Family/Significant other contact made: No, will contact:  when given permission Patient understands diagnosis: No. Discussing patient identified problems/goals with staff: Yes. Medical problems stabilized or resolved: Yes. Denies suicidal/homicidal ideation: Yes. Issues/concerns per patient self-inventory: No. Other: none  New problem(s) identified: No, Describe:  none  New Short Term/Long Term Goal(s): Pt goal: "I don't really know."  Discharge Plan or Barriers: CSW assessing for appropriate plan.  Reason for Continuation of Hospitalization: Delusions  Medication stabilization  Estimated Length of Stay: 5  days.  Attendees: Patient: 09/28/2016   Physician: Dr. Ardyth HarpsHernandez, MD 09/28/2016  Nursing:  09/28/2016   RN Care Manager: 09/28/2016   Social Worker: Daleen SquibbGreg Josilyn Shippee, LCSW 09/28/2016   Recreational Therapist:  09/28/2016   Other: Andrey SpearmanJenna, C, PA Student 09/28/2016   Other:  09/28/2016   Other: 09/28/2016         Scribe for Treatment Team: Lorri FrederickWierda, Amara Manalang Jon, LCSW 09/28/2016 1:21 PM

## 2016-09-28 NOTE — [facility] Group Notes (Signed)
[facility] LCSW Group Therapy   09/28/2016  1:00 pm   Type of Therapy: Group Therapy   Participation Level: Active   Participation Quality: Attentive, Sharing and Supportive   Affect: Appropriate   Cognitive: Alert and Oriented   Insight: Developing/Improving and Engaged   Engagement in Therapy: Developing/Improving and Engaged   Modes of Intervention: Clarification, Confrontation, Discussion, Education, Exploration, Limit-setting, Orientation, Problem-solving, Rapport Building, Dance movement psychotherapisteality Testing, Socialization and Support   Summary of Progress/Problems: The topic for group today was emotional regulation. This group focused on both positive and negative emotion identification and allowed  group members to process ways to identify feelings, regulate negative emotions, and find healthy ways to manage internal/external emotions. Group members were asked to reflect on a time when their reaction to an emotion led to a negative outcome and explored how alternative responses using emotion regulation would have benefited them. Group members were also asked to discuss a time when emotion regulation was utilized when a negative emotion was experienced. Patient defined what emotion regulation meant to her. Patient identified emotions that he felt before being admitted to the hospital and the behavior that came with it. He also identified coping mechanisms or opposite actions in order to regulate those negative emotions.    Hampton AbbotKadijah Gregg Winchell, MSW, LCSW-A 09/28/2016, 3:12PM

## 2016-09-28 NOTE — Progress Notes (Signed)
Patient ID: Monica Fischer, female   DOB: [birth year], 21 y.o.   MRN: [id number] Neat, showered, reading a novel in her room, the room littered with personal clothes in room and everywhere, "I went to court today, the judge did not ask me many questions..." When asked if she still think that her father means harm to her, the bomb in her car? She is fixated on this delusions and paranoia. She is medication compliant.

## 2016-09-28 NOTE — Plan of Care (Signed)
Problem: Activity: Goal: Interest or engagement in activities will improve Outcome: Progressing Attended and engaged in groups appropriately
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
## 2016-09-28 NOTE — Progress Notes (Signed)
CSW spoke with [employer], Ardyth GalWhitney Lyberti Thrush.  She would like to come to [facility] and meet with pt tomorrow morning to discuss the situation.  Pt is going to face "university charges" related to the code of conduct.  Plan to meet tomorrow AM at 9. Garner NashGregory Boysie Bonebrake, MSW, LCSW Clinical Social Worker 09/28/2016 2:43 PM

## 2016-09-28 NOTE — Progress Notes (Signed)
Pt remains guarded, frequently isolative.  Pleasant on approach, smiling more frequently.  Avertive eye contact.  Interacting appropriately and attending/engaging in groups.  Med compliant.  No behavioral issues.

## 2016-09-28 NOTE — [facility] Group Notes (Signed)
[facility] Group Notes:  (Nursing/MHT/Case Management/Adjunct)  Date:  09/28/2016  Time:  9:28 PM  Type of Therapy:  Group Therapy  Participation Level:  Active  Participation Quality:  Appropriate  Affect:  Appropriate  Cognitive:  Alert  Insight:  Good  Engagement in Group:  Supportive  Modes of Intervention:  Support  Summary of Progress/Problems:  Monica Fischer 09/28/2016, 9:28 PM

## 2016-09-29 MED ORDER — OLANZAPINE 5 MG PO TBDP
5.0000 mg | ORAL_TABLET | Freq: Every day | ORAL | Status: DC
  Administered 2016-09-29: 5 mg via ORAL
  Filled 2016-09-29: qty 1

## 2016-09-29 NOTE — Progress Notes (Signed)
Patient ID: Monica MeekGreta Fischer, female   DOB: 1995/12/21, 21 y.o.   MRN: [id number] Improved cognitions, improved appearance, visitation by BF, Marcello Mooressaac, was very supportive; sociable, interacting and med compliant

## 2016-09-29 NOTE — Progress Notes (Signed)
Pt remains calm, cooperative and pleasant.  Guarded, but brightens on approach.  Pt reports meeting with [employer] representative and being notified that she may not be able to return to school this semester.  Pt seems to be handling this well, "I hope that doesn't happen, I want to go back, but I'll figure something out if it does."  Pt reports she not spoken to her parents.  Med compliant.  No behavioral issues.
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
## 2016-09-29 NOTE — Plan of Care (Signed)
Problem: Coping: Goal: Ability to demonstrate self-control will improve Outcome: Progressing Pt coped appropriately and controlled self when being notified that she may not be able to return to school this semester.

## 2016-09-29 NOTE — Plan of Care (Signed)
Problem: Activity: Goal: Sleeping patterns will improve Outcome: Progressing Patient slept for Estimated Hours of 7; Precautionary checks every 15 minutes for safety maintained, room free of safety hazards, patient sustains no injury or falls during this shift.    

## 2016-09-29 NOTE — [facility] Group Notes (Signed)
[facility] LCSW Group Therapy Note  Date/Time: 09/29/16, 1300  Type of Therapy/Topic:  Group Therapy:  Balance in Life  Participation Level:  minimal  Description of Group:    This group will address the concept of balance and how it feels and looks when one is unbalanced. Patients will be encouraged to process areas in their lives that are out of balance, and identify reasons for remaining unbalanced. Facilitators will guide patients utilizing problem- solving interventions to address and correct the stressor making their life unbalanced. Understanding and applying boundaries will be explored and addressed for obtaining  and maintaining a balanced life. Patients will be encouraged to explore ways to assertively make their unbalanced needs known to significant others in their lives, using other group members and facilitator for support and feedback.  Therapeutic Goals: 1. Patient will identify two or more emotions or situations they have that consume much of in their lives. 2. Patient will identify signs/triggers that life has become out of balance:  3. Patient will identify two ways to set boundaries in order to achieve balance in their lives:  4. Patient will demonstrate ability to communicate their needs through discussion and/or role plays  Summary of Patient Progress: Pt did not participate much in group discussion today.  Pt only said that her diet is not good at the moment, and this was when CSW called on her.  No other contributions to the group discussion.          Therapeutic Modalities:   Cognitive Behavioral Therapy Solution-Focused Therapy Assertiveness Training  Daleen SquibbGreg Ryland Smoots, [place]CS[place]

## 2016-09-29 NOTE — Progress Notes (Signed)
CSW and pt met with Cranford Mon, Asst [name] of Students, [employer].  Ms. Belenda Cruise informed pt that a code of conduct hearing would need to take place prior to her return to school and she has been informed that the likely outcome of that hearing would be a suspension from school of at least one semester. Pt would not be allowed to live at her on campus housing while that process is playing out.  Ms. Belenda Cruise said that pt could take a medical withdrawal, which would delay this process until she wants to return.  Pt informed Ms. Belenda Cruise that she would like to go ahead and initiate the hearing process.  Pt believes she can stay with her boyfriend until it is complete. Winferd Humphrey, MSW, LCSW Clinical Social Worker 09/29/2016 11:19 AM

## 2016-09-30 MED ORDER — OLANZAPINE 5 MG PO TBDP: 7.5000 mg | ORAL_TABLET | Freq: Every day | ORAL | Status: DC

## 2016-09-30 MED ORDER — OLANZAPINE 5 MG PO TBDP
7.5000 mg | ORAL_TABLET | Freq: Every day | ORAL | Status: DC
  Administered 2016-09-30 – 2016-10-04 (×5): 7.5 mg via ORAL
  Filled 2016-09-30 (×6): qty 2

## 2016-09-30 NOTE — [facility] Group Notes (Signed)
[facility] LCSW Group Therapy Note  Date/Time: 09/30/16, 0930  Type of Therapy and Topic:  Group Therapy:  Feelings around Relapse and Recovery  Participation Level:  Minimal   Mood: somewhat quiet  Description of Group:    Patients in this group will discuss emotions they experience before and after a relapse. They will process how experiencing these feelings, or avoidance of experiencing them, relates to having a relapse. Facilitator will guide patients to explore emotions they have related to recovery. Patients will be encouraged to process which emotions are more powerful. They will be guided to discuss the emotional reaction significant others in their lives may have to patients' relapse or recovery. Patients will be assisted in exploring ways to respond to the emotions of others without this contributing to a relapse.  Therapeutic Goals: 1. Patient will identify two or more emotions that lead to relapse for them:  2. Patient will identify two emotions that result when they relapse:  3. Patient will identify two emotions related to recovery:  4. Patient will demonstrate ability to communicate their needs through discussion and/or role plays.   Summary of Patient Progress: Pt identified fear as an emotion that is difficult for her.  She shared that writing music and talking to others are positive ways that she handles difficult emotions.  Pt did not contribute much during group but did answer questions when asked by CSW.  Appropriate participation.     Therapeutic Modalities:   Cognitive Behavioral Therapy Solution-Focused Therapy Assertiveness Training Relapse Prevention Therapy  Daleen SquibbGreg Ajay Strubel, LCSW

## 2016-09-30 NOTE — Progress Notes (Signed)
Pt refused neosporin as ordered due to the laceration on her toe healing. Pleasant, calm, and cooperative. Attends groups. Appropriately interacts with staff/peers. Attended group today. No complaints today. Continues to be guarded, forwarding little unless approached. Support and encouragement provided. Safety maintained with every 15 minute checks. Will continue to monitor.

## 2016-09-30 NOTE — [facility] Suicide Risk Assessment (Signed)
[facility] INPATIENT:  Family/Significant Other Suicide Prevention Education  Suicide Prevention Education:  Patient Refusal for Family/Significant Other Suicide Prevention Education: The patient Monica Fischer has refused to provide written consent for family/significant other to be provided Family/Significant Other Suicide Prevention Education during admission and/or prior to discharge.  Physician notified.  Lorri FrederickWierda, Kin Galbraith Jon, LCSW 09/30/2016, 8:34 AM   Documented by Renaye RakersG Rima Blizzard based on note from A Sampson.
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
## 2016-09-30 NOTE — Plan of Care (Signed)
Problem: Coping: Goal: Ability to verbalize frustrations and anger appropriately will improve Outcome: Progressing Pt was cooperative with treatment and is able to voice feelings appropriately,

## 2016-09-30 NOTE — Progress Notes (Signed)
   09/30/16 1405  Clinical Encounter Type  Visited With Patient;Health care provider;Other (Comment)  Visit Type Spiritual support;Behavioral Health  Referral From Chaplain   Patient came out for afternoon group. Patient spent group speaking with another patient.

## 2016-09-30 NOTE — Progress Notes (Signed)
Calm and cooperative. Bright affect. Med compliant. Attends groups. Interacting with peers and staff. No voiced thoughts of hurting herself. No behavior issues noted. q 15 min checks maintained for safety.
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
## 2016-09-30 NOTE — Progress Notes (Signed)
Edgardo RoysGreta was cooperative on shift with treatment. She interacted well with peers and staff, she was medication compliant. She did attend the group on shift but she did not participate, she is currently in bed resting eyes closed at this time, will continue to monitor. No behavioral issues to report on shift at this time.

## 2016-10-01 NOTE — [facility] Group Notes (Signed)
[facility] Group Notes:  (Nursing/MHT/Case Management/Adjunct)  Date:  10/01/2016  Time:  7:06 AM  Type of Therapy:  Psychoeducational Skills  Participation Level:  Active  Participation Quality:  Appropriate and Attentive  Affect:  Appropriate  Cognitive:  Appropriate  Insight:  Appropriate and Good  Engagement in Group:  Engaged  Modes of Intervention:  Discussion, Socialization and Support  Summary of Progress/Problems:  Monica MilroyLaquanda Y Anjel Fischer 10/01/2016, 7:06 AM

## 2016-10-01 NOTE — [facility] Group Notes (Signed)
[facility] LCSW Group Therapy 10/01/2016  1:00pm Mood: Good  Group Topic: Cognitive Distortions Pt response: Did not attend  Glennon MacSara P Morrie Daywalt, LCSW 10/01/2016, 4:11 PM
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
## 2016-10-01 NOTE — Plan of Care (Signed)
Problem: Safety: Goal: Ability to remain free from injury will improve Outcome: Progressing Pt safe on the unit at this time   

## 2016-10-01 NOTE — Progress Notes (Signed)
D: Pt denies SI/HI/AVH. Pt is pleasant and cooperative. Pt stated she felt better" more well rested" pt plans to continue out pt therapy with her current provider.   A: Pt was offered support and encouragement. Pt was given scheduled medications. Pt was encourage to attend groups. Q 15 minute checks were done for safety.   R:Pt attends groups and interacts well with peers and staff. Pt is taking medication. Pt has no complaints.Pt receptive to treatment and safety maintained on unit.

## 2016-10-02 NOTE — Plan of Care (Signed)
Problem: Safety: Goal: Periods of time without injury will increase Outcome: Progressing Patient remains safe and without injury during hospitalization and on Q 15 minute observation. Will continue to monitor patient.    

## 2016-10-02 NOTE — Progress Notes (Signed)
Patient has been more withdrawn to room today and reports having a little anxiety because she believes one of the patients work for her father. Staff ensured patient that she is safe and if need to talk with anyone just let us know. Patient is compliant with B12 injection given today. Patient denies any SI/HI/AH/VH. Patient is alert and oriented x 4, breathing unlabored, and extremities x 4 within normal limits. Patient is calm and cooperative. Patient did not display any disruptive behavior. Patient continues to be monitored on 15 minute safety checks. Will continue to monitor patient and notify MD of any changes.

## 2016-10-02 NOTE — [facility] Group Notes (Signed)
[facility] Group Notes:  (Nursing/MHT/Case Management/Adjunct)  Date:  10/02/2016  Time:  7:01 AM  Type of Therapy:  Psychoeducational Skills  Participation Level:  Active  Participation Quality:  Appropriate and Attentive  Affect:  Appropriate  Cognitive:  Appropriate  Insight:  Appropriate and Good  Engagement in Group:  Engaged  Modes of Intervention:  Discussion, Socialization and Support  Summary of Progress/Problems:  Monica Fischer 10/02/2016, 7:01 AM

## 2016-10-02 NOTE — [facility] Group Notes (Signed)
  [facility] LCSW Group Therapy  10/02/2016  1:00pm Group Topic:Thoughts and Feelings Therapeutic Goals: Utilize CBT and Affirmation cards to challenge cognitive distortions.  Pt's will be able to identify types of distortions and the feelings that they create for them.  Also demonstrate understanding of Affirmations and how they can be used to encourage more positive thinking style. Methods utilized: CBT, Affirmation cards Patient response: Pt listened attentively when present, left during middle of group and returned at the end.   Cleda DaubSara P Delani Kohli, LCSW 10/02/2016, 4:01 PM
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
## 2016-10-03 DIAGNOSIS — F32A Depression, unspecified: Secondary | ICD-10-CM

## 2016-10-03 DIAGNOSIS — E538 Deficiency of other specified B group vitamins: Secondary | ICD-10-CM

## 2016-10-03 DIAGNOSIS — F329 Major depressive disorder, single episode, unspecified: Secondary | ICD-10-CM

## 2016-10-03 MED ORDER — CYANOCOBALAMIN 1000 MCG/ML IJ SOLN: 1000.0000 ug | INTRAMUSCULAR | 0 refills | Status: DC

## 2016-10-03 MED ORDER — OLANZAPINE 10 MG PO TABS: 10.0000 mg | ORAL_TABLET | Freq: Every day | ORAL | 0 refills | Status: DC

## 2016-10-03 NOTE — [facility] Group Notes (Signed)
[facility] Group Notes:  (Nursing/MHT/Case Management/Adjunct)  Date:  10/03/2016  Time:  8:54 PM  Type of Therapy:  Group Therapy  Participation Level:  Active  Participation Quality:  Appropriate  Affect:  Appropriate  Cognitive:  Alert  Insight:  Good  Engagement in Group:  Engaged  Modes of Intervention:  Support  Summary of Progress/Problems:  Mayra NeerJackie L Xaviera Flaten 10/03/2016, 8:54 PM
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
## 2016-10-03 NOTE — Progress Notes (Signed)
Pt calm, cooperative and pleasant.  Pt reports discharge is planned for tomorrow, "I feel ready."  Reports she hopes to return to living on campus, "I need to meet with the university to see if I can."   Boyfriend visited during which pt left visitor intermittently and made several phone calls.

## 2016-10-03 NOTE — Plan of Care (Signed)
Problem: Health Behavior/Discharge Planning: Goal: Compliance with treatment plan for underlying cause of condition will improve Outcome: Progressing Compliant with prescribed medications

## 2016-10-03 NOTE — Tx Team (Signed)
Interdisciplinary Treatment and Diagnostic Plan Update  10/03/2016 Time of Session: [address] J[address] Maryjean Mornnne Mcfall MRN: [id number]  Principal Diagnosis: Delusional disorder [facility](HCC)  Secondary Diagnoses: Principal Problem:   Delusional disorder (HCC) Active Problems:   B12 deficiency   Depressive disorder   Current Medications:  Current Facility-Administered Medications  Medication Dose Route Frequency Provider Last Rate Last Dose  . acetaminophen (TYLENOL) tablet 650 mg  650 mg Oral Q6H PRN Clapacs, Jackquline DenmarkJohn T, MD   650 mg at 09/25/16 1419  . alum & mag hydroxide-simeth (MAALOX/MYLANTA) 200-200-20 MG/5ML suspension 30 mL  30 mL Oral Q4H PRN Clapacs, John T, MD      . cyanocobalamin ((VITAMIN B-12)) injection 1,000 mcg  1,000 mcg Subcutaneous Daily Jimmy FootmanHernandez-Gonzalez, Andrea, MD   1,000 mcg at 10/03/16 0839  . ibuprofen (ADVIL,MOTRIN) tablet 600 mg  600 mg Oral Q6H PRN Darliss RidgelKapur, Aarti K, MD   600 mg at 09/25/16 2243  . magnesium hydroxide (MILK OF MAGNESIA) suspension 30 mL  30 mL Oral Daily PRN Clapacs, John T, MD      . neomycin-bacitracin-polymyxin (NEOSPORIN) ointment   Topical BID Clapacs, John T, MD      . OLANZapine zydis (ZYPREXA) disintegrating tablet 7.5 mg  7.5 mg Oral Daily Jimmy FootmanHernandez-Gonzalez, Andrea, MD   7.5 mg at 10/02/16 2120   PTA Medications: No prescriptions prior to admission.    Patient Stressors: Traumatic event Other: Fixed Delusional Belief: "I think I am confused, my mind is playing tricks on me.."  Patient Strengths: Ability for insight Average or above average intelligence Capable of independent living Motivation for treatment/growth Supportive family/friends  Treatment Modalities: Medication Management, Group therapy, Case management,  1 to 1 session with clinician, Psychoeducation, Recreational therapy.   Physician Treatment Plan for Primary Diagnosis: Delusional disorder [facility](HCC) Long Term Goal(s): Improvement in symptoms so as ready for discharge Improvement in  symptoms so as ready for discharge   Short Term Goals: Ability to identify changes in lifestyle to reduce recurrence of condition will improve Ability to verbalize feelings will improve Ability to demonstrate self-control will improve Ability to identify and develop effective coping behaviors will improve Ability to identify triggers associated with substance abuse/mental health issues will improve  Medication Management: Evaluate patient's response, side effects, and tolerance of medication regimen.  Therapeutic Interventions: 1 to 1 sessions, Unit Group sessions and Medication administration.  Evaluation of Outcomes: Adequate for Discharge  Physician Treatment Plan for Secondary Diagnosis: Principal Problem:   Delusional disorder (HCC) Active Problems:   B12 deficiency   Depressive disorder  Long Term Goal(s): Improvement in symptoms so as ready for discharge Improvement in symptoms so as ready for discharge   Short Term Goals: Ability to identify changes in lifestyle to reduce recurrence of condition will improve Ability to verbalize feelings will improve Ability to demonstrate self-control will improve Ability to identify and develop effective coping behaviors will improve Ability to identify triggers associated with substance abuse/mental health issues will improve     Medication Management: Evaluate patient's response, side effects, and tolerance of medication regimen.  Therapeutic Interventions: 1 to 1 sessions, Unit Group sessions and Medication administration.  Evaluation of Outcomes: Adequate for Discharge   RN Treatment Plan for Primary Diagnosis: Delusional disorder [facility](HCC) Long Term Goal(s): Knowledge of disease and therapeutic regimen to maintain health will improve  Short Term Goals: Ability to identify and develop effective coping behaviors will improve and Compliance with prescribed medications will improve  Medication Management: RN will administer medications  as ordered by provider,  will assess and evaluate patient's response and provide education to patient for prescribed medication. RN will report any adverse and/or side effects to prescribing provider.  Therapeutic Interventions: 1 on 1 counseling sessions, Psychoeducation, Medication administration, Evaluate responses to treatment, Monitor vital signs and CBGs as ordered, Perform/monitor CIWA, COWS, AIMS and Fall Risk screenings as ordered, Perform wound care treatments as ordered.  Evaluation of Outcomes: Adequate for Discharge   LCSW Treatment Plan for Primary Diagnosis: Delusional disorder [facility](HCC) Long Term Goal(s): Safe transition to appropriate next level of care at discharge, Engage patient in therapeutic group addressing interpersonal concerns.  Short Term Goals: Engage patient in aftercare planning with referrals and resources, Increase social support, Facilitate acceptance of mental health diagnosis and concerns and Increase skills for wellness and recovery  Therapeutic Interventions: Assess for all discharge needs, 1 to 1 time with Social worker, Explore available resources and support systems, Assess for adequacy in community support network, Educate family and significant other(s) on suicide prevention, Complete Psychosocial Assessment, Interpersonal group therapy.  Evaluation of Outcomes: Adequate for Discharge   Progress in Treatment: Attending groups: Yes. Participating in groups: Yes. Taking medication as prescribed: No. Toleration medication: Yes. Family/Significant other contact made: No, will contact:  when given permission Patient understands diagnosis: No. Discussing patient identified problems/goals with staff: Yes. Medical problems stabilized or resolved: Yes. Denies suicidal/homicidal ideation: Yes. Issues/concerns per patient self-inventory: No. Other: none  New problem(s) identified: No, Describe:  none  New Short Term/Long Term Goal(s): Pt goal: "I don't really  know."  Discharge Plan or Barriers: Pt will return to current therapist, [facility], and [facility]  Reason for Continuation of Hospitalization: Delusions  Medication stabilization  Estimated Length of Stay: 1-2 days.  Attendees: Patient: 10/03/2016   Physician: Dr. Ardyth HarpsHernandez, MD 10/03/2016   Nursing: Leonia ReaderPhyllis Cobb, RN 10/03/2016   RN Care Manager: 10/03/2016   Social Worker: Daleen SquibbGreg Ary Lavine, LCSW 10/03/2016   Recreational Therapist: Hershal CoriaBeth Greene, LRT/CTRS  10/03/2016   Other:  10/03/2016   Other:  10/03/2016   Other: 10/03/2016            Scribe for Treatment Team: Lorri FrederickWierda, Ephriam Turman Jon, LCSW 10/03/2016 11:46 AM

## 2016-10-03 NOTE — Progress Notes (Signed)
D: Pt denies SI/HI/AV. Pt continue to minimize, but endorses feeling better than she did when she 1st came in. Pt has been visible on the unit, minimal interaction this evening with peers  A: Pt was offered support and encouragement. Pt was given scheduled medications. Pt was encourage to attend groups. Q 15 minute checks were done for safety.   R:Pt attends groups and interacts  with peers and staff. Pt is taking medication. Pt has no complaints.Pt receptive to treatment and safety maintained on unit.

## 2016-10-03 NOTE — [facility] Group Notes (Signed)
[facility] LCSW Group Therapy   10/03/2016 1pm Type of Therapy: Group Therapy   Participation Level: Active   Participation Quality: Attentive, Sharing and Supportive   Affect: Appropriate   Cognitive: Alert and Oriented   Insight: Developing/Improving and Engaged   Engagement in Therapy: Developing/Improving and Engaged   Modes of Intervention: Clarification, Confrontation, Discussion, Education, Exploration,  Limit-setting, Orientation, Problem-solving, Rapport Building, Dance movement psychotherapisteality Testing, Socialization and Support   Summary of Progress/Problems: Pt identified obstacles faced currently and processed barriers involved in overcoming these obstacles. Pt identified steps necessary for overcoming these obstacles and explored motivation (internal and external) for facing these difficulties head on. Pt further identified one area of concern in their lives and chose a goal to focus on for today. Pt stated that her obstacle is "not trusting her family." She identified coping mechanisms such as breathing exercises when she feels overwhelmed in order to overcome his current obstacle.   Hampton AbbotKadijah Lachrista Heslin, MSW, LCSW-A 10/03/2016, 1:29PM
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
## 2016-10-03 NOTE — Progress Notes (Signed)
Recreation Therapy Notes  Date: 07.30.18 Time: 9:30 am Location: Craft Room  Group Topic: Self-expression  Goal Area(s) Addresses:  Patient will identify one color per emotion listed on wheel. Patient will verbalize benefit of using art as a means of self-expression. Patient will verbalized one emotion experienced during session. Patient will be educated on other forms of self-expression.  Behavioral Response: Attentive, Interactive  Intervention: Emotion Wheel  Activity: Patients were given an Arboriculturistmotion Wheel worksheet and were instructed to pick a color for each emotion listed on the wheel.  Education: LRT educated patient on different forms of self-expression.  Education Outcome: Acknowledges education/In group clarification offered  Clinical Observations/Feedback: Patient picked a color for each emotion. Patient contributed to group discussion by stating what colors she picked for certain emotions and why, what emotions she felt during group, and why people tend to explode their emotions on their loved ones.   Jacquelynn CreeGreene,Meeyah Ovitt M, LRT/CTRS 10/03/2016 10:10 AM
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
## 2016-10-04 NOTE — Progress Notes (Signed)
Pt reports "feeling better". She denies SI, HI, a/v hallucinations. Pt compliant with all medications. No issues noted at times. No delusional thoughts voiced. Will continue to monitor.

## 2016-10-04 NOTE — Plan of Care (Signed)
Problem: Safety: Goal: Periods of time without injury will increase Outcome: Progressing Pt remains safe while in hospital injury free.    
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
## 2016-10-04 NOTE — Progress Notes (Signed)
Recreation Therapy Notes  Date: 07.31.18 Time: 9:30 am Location: Craft Room  Group Topic: Coping Skills  Goal Area(s) Addresses:  Patient will verbalize importance of recognizing emotions. Patient will identify at least one emotion. Patient will identify healthy coping skills for emotions listed.  Behavioral Response: Attentive, Interactive  Intervention: Emotion Wheel  Activity: Patients were given an Arboriculturistmotion Wheel worksheet and were instructed as a group to come up with 8 emotions they feel in the process of recovery. After they had identified 8 emotions, patients were instructed to write healthy coping skills to help them cope with that emotion.  Education: LRT educated patients on healthy coping skills.  Education Outcome: In group clarification offered  Clinical Observations/Feedback: Patient identified emotions she felt in the process of recovery. Patient identified coping skills to help her cope with those emotions. Patient did not contribute to group discussion.  Jacquelynn CreeGreene,Shelsie Tijerino M, LRT/CTRS 10/04/2016 10:04 AM

## 2016-10-04 NOTE — Plan of Care (Signed)
Problem: Activity: Goal: Sleeping patterns will improve Outcome: Progressing Patient slept for Estimated Hours of 7.30; Precautionary checks every 15 minutes for safety maintained, room free of safety hazards, patient sustains no injury or falls during this shift.    

## 2016-10-05 MED ORDER — OLANZAPINE 5 MG PO TBDP: 2.5000 mg | ORAL_TABLET | Freq: Once | ORAL | Status: DC

## 2016-10-05 MED ORDER — OLANZAPINE 10 MG PO TABS: 10.0000 mg | ORAL_TABLET | Freq: Every day | ORAL | Status: DC

## 2016-10-05 NOTE — [facility] Group Notes (Signed)
[facility] Group Notes:  (Nursing/MHT/Case Management/Adjunct)  Date:  10/05/2016  Time:  6:50 AM  Type of Therapy:  Psychoeducational Skills  Participation Level:  Did Not Attend   Summary of Progress/Problems:  Monica MilroyLaquanda Y Mirjana Fischer 10/05/2016, 6:50 AM

## 2016-10-05 NOTE — [facility] Group Notes (Signed)
  [facility]  Date/Time  Type of Therapy/Topic:  Group Therapy:  Emotion Regulation  Participation Level:  Did Not Attend    Glennon MacSara P Ronith Berti, LCSW 10/05/2016, 4:42 PM

## 2016-10-05 NOTE — Progress Notes (Signed)
Patient ID: Monica Fischer, female   DOB: 11-19-1995, 21 y.o.   MRN: [id number] Less nervous today, visitation by BF went well, excited, "I talked to my mom today, we are looking forward to Friday, she is making some arrangements for me. I may have to transfer out from [place]; we will see what happens.Marland Kitchen.Marland Kitchen."

## 2016-10-05 NOTE — Progress Notes (Signed)
Pt without complaints, calm and cooperative. Denies SI/HI/AVH, pain. Voices being discharged in a few days "to go to another treatment center. I'm ok with that." Pt did not elaborate on much more about these plans, but appeared hopeful. Refuses neosporin as ordered due to wound on foot is healed. Medication compliant. Safety maintained with every 15 minute checks. Will continue to monitor.

## 2016-10-05 NOTE — Plan of Care (Signed)
Problem: Safety: Goal: Ability to remain free from injury will improve Outcome: Progressing Pt did have a witnessed, assisted fall today without injury. Safety maintained with every 15 minute checks.  Problem: Activity: Goal: Interest or engagement in activities will improve Outcome: Progressing Attends some groups, but stays to self for the most part. Support and encouragement provided. Goal: Sleeping patterns will improve Outcome: Progressing Reports good sleep last night. It is reported she slept 7 1/2 hours last night.  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 Support provided.

## 2016-10-05 NOTE — Progress Notes (Signed)
Pt currently visiting in dayroom with mother and father present. Appears to be in no acute distress. Safety maintained. Will continue to monitor.

## 2016-10-05 NOTE — Progress Notes (Addendum)
1145 : Pt observed standing at nurse's station door speaking to Dr. Jerilee Hoh reporting that she felt dizzy. Pt placed her hand on her forehead, dropped her head. All of a sudden, pt fell to the wall, grabbing the railing on the wall, assisting herself into the floor. Dr.  Jerilee Hoh and this nurse assisted pt to a chair sitting nearby. Pt continues to appear limp, almost falling out of the chair unless redirected to sit up straight, stay in chair. Pt with rapid breathing, encouraged to slow breathing and open her eyes.  11:48 Eventually, this nurse and Tai, RN assisted pt to her room, into her bed. VS stable. Alert and oriented. Complains of nausea. Denies pain. Did not appear to hit her head and reports she does not think she hit her head. Provided fluids, educated pt on use of call light, to not get out of bed without assistance. Pt verbalized understanding. Refusing lunch at this time. Safety maintained. Will continue to monitor.   1203: Pt's boyfriend, Bland Span phoned to speak with the patient. He was informed by Meredith Mody, RN that patient had fell and spoke with the patient on the phone.

## 2016-10-05 NOTE — Progress Notes (Signed)
Pt called to nurse station requesting assistance to go to bathroom. Provided stand by assist without issue while patient ambulated to bathroom. Reports "I'm feeling much better." Pt urinated, washed her hands and returned to bed. Safety maintained. Will continue to monitor.

## 2016-10-05 NOTE — Progress Notes (Signed)
Recreation Therapy Notes  Date: 08.01.18 Time: 9:30 am Location: Craft Room  Group Topic: Self-esteem  Goal Area(s) Addresses:  Patient will write at least one positive traits about self. Patient will verbalize benefit of having a healthy self-esteem.  Behavioral Response: Did not attend  Intervention: I Am  Activity: Patients were given a worksheet with the letter I on it and were instructed to write positive traits about themselves inside the letter.  Education: LRT educated patients on ways they can increase their self-esteem.  Education Outcome: Patient did not attend group.   Clinical Observations/Feedback: Patient did not attend group.  Jacquelynn CreeGreene,Chariah Bailey M, LRT/CTRS 10/05/2016 10:11 AM

## 2016-10-05 NOTE — Plan of Care (Signed)
Problem: Activity: Goal: Sleeping patterns will improve Outcome: Progressing Patient slept for Estimated Hours of 7.30; Precautionary checks every 15 minutes for safety maintained, room free of safety hazards, patient sustains no injury or falls during this shift.    
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
## 2016-10-05 NOTE — Social Work (Signed)
CSW spoke with admissions coordinator, Lequita HaltMorgan @ [phone] at [facility] who stated that pt's insurance will not cover any residential treatment. Lequita HaltMorgan stated that the financial department at [facility] will contact pt's father in regards to other options such as private pay. CSW will stay in contact with both family and [facility] to make a decision for possible discharge tomorrow.  Hampton AbbotKadijah Isaura Schiller, MSW, LCSW-A 10/05/2016, 4:04PM

## 2016-10-06 MED ORDER — OLANZAPINE 5 MG PO TBDP: 10.0000 mg | ORAL_TABLET | Freq: Every day | ORAL | Status: DC

## 2016-10-06 MED ORDER — OLANZAPINE 5 MG PO TBDP
10.0000 mg | ORAL_TABLET | Freq: Every day | ORAL | Status: DC
  Administered 2016-10-06: 10 mg via ORAL
  Filled 2016-10-06: qty 2

## 2016-10-06 NOTE — Plan of Care (Signed)
Problem: Activity: Goal: Interest or engagement in activities will improve Outcome: Progressing Slightly improved  Problem: Education: Goal: Knowledge of [facility] Education information/materials will improve Outcome: Not Progressing Pt slowly working through this Goal: Emotional status will improve Outcome: Not Progressing Depressed, anxious and sad
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
## 2016-10-06 NOTE — Progress Notes (Signed)
Recreation Therapy Notes  Date: 08.02.18 Time: 1:00 pm Location: Craft Room  Group Topic: Leisure Education  Goal Area(s) Addresses:  Patient will identify activities for each letter of the alphabet. Patient will verbalize ability to integrate positive leisure into life post d/c. Patient will verbalize ability to use leisure as a Associate Professorcoping skill.  Behavioral Response: Attentive, Interactive  Intervention: Leisure Alphabet.  Activity: Patients were given a Leisure Information systems managerAlphabet worksheet and were instructed to write healthy leisure activities for each letter of the alphabet.   Education: LRT educated patients on what they need to participate in leisure.  Education Outcome: In group clarification offered   Clinical Observations/Feedback: Patient wrote healthy leisure activities. Patient contributed to group discussion by stating some of her healthy leisure activities.  Jacquelynn CreeGreene,Chanie Soucek M, LRT/CTRS 10/06/2016 1:44 PM

## 2016-10-06 NOTE — Progress Notes (Signed)
Patient pleasant and cooperative. More active on today. Brighter affect. Denies SI, HI, AVH. No psychotic episodes.  Encouragement and support offered. Safety checks maintained. Pt receptive and remains safe on unit with q 15 min checks.
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
## 2016-10-06 NOTE — Plan of Care (Signed)
Problem: Activity: Goal: Interest or engagement in activities will improve Outcome: Progressing Patient participating in groups, interacting with peers.

## 2016-10-06 NOTE — [facility] Group Notes (Signed)
[facility] Group Notes:  (Nursing/MHT/Case Management/Adjunct)  Date:  10/06/2016  Time:  7:11 AM  Type of Therapy:  Psychoeducational Skills  Participation Level:  Active  Participation Quality:  Appropriate and Attentive  Affect:  Appropriate  Cognitive:  Appropriate  Insight:  Appropriate  Engagement in Group:  Engaged  Modes of Intervention:  Discussion, Socialization and Support  Summary of Progress/Problems:  Chancy MilroyLaquanda Y Marlette Curvin 10/06/2016, 7:11 AM

## 2016-10-06 NOTE — Progress Notes (Signed)
Pt appeared to sleep about 8.25 hours while monitored on 15 minute safety checks. 
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
## 2016-10-06 NOTE — Progress Notes (Addendum)
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 be going to [place].  I spoke very clearly with the patient and told her she needs to talk to her mother and communicate her reservations about staying near her father. Plans for discharge today will be canceled as patient has had an excessive duration of anxiety, she fainted yesterday, and today she is having thoughts of self-harm. Dose of olanzapine has been increased and patient will be strongly encouraged to continue medications while in the hospital.  Hildred Priest, MD 10/06/2016, 9:52 AM

## 2016-10-07 MED ORDER — OLANZAPINE 5 MG PO TBDP
10.0000 mg | ORAL_TABLET | Freq: Every day | ORAL | Status: DC
  Administered 2016-10-07 – 2016-10-10 (×4): 10 mg via ORAL
  Filled 2016-10-07 (×4): qty 2

## 2016-10-07 NOTE — Plan of Care (Signed)
Problem: Activity: Goal: Interest or engagement in activities will improve Outcome: Progressing Attending unit programing  Goal: Sleeping patterns will improve Outcome: Progressing Voice no concerns around sleep.  Problem: Education: Goal: Knowledge of [facility] information/materials will improve Able to verbalize and understand information received  Goal: Emotional status will improve Outcome: Progressing Affect cheerful . Goal: Mental status will improve Outcome: Progressing Improving  Goal: Verbalization of understanding the information provided will improve Verbalize understanding of information   Problem: Coping: Goal: Ability to verbalize frustrations and anger appropriately will improve Able to come to staff for any  Concerns  Goal: Ability to demonstrate self-control will improve Outcome: Progressing No issues with  Inappropriate behavior   Problem: Health Behavior/Discharge Planning: Goal: Identification of resources available to assist in meeting health care needs will improve Outcome: Progressing Able to identity  Out side resources , as with her therapist ,  Goal: Compliance with treatment plan for underlying cause of condition will improve Outcome: Progressing Attending unit programing   Problem: Physical Regulation: Goal: Ability to maintain clinical measurements within normal limits will improve Outcome: Progressing Attending  Unit programing   Problem: Safety: Goal: Periods of time without injury will increase Outcome: Progressing No injury during this admission

## 2016-10-07 NOTE — [facility] Group Notes (Signed)
[facility] Group Notes:  (Nursing/MHT/Case Management/Adjunct)  Date:  10/07/2016  Time:  9:29 PM  Type of Therapy:  Evening Wrap-up Group  Participation Level:  Active  Participation Quality:  Appropriate and Attentive  Affect:  Appropriate  Cognitive:  Alert and Appropriate  Insight:  Appropriate, Good and Improving  Engagement in Group:  Developing/Improving and Engaged  Modes of Intervention:  Discussion  Summary of Progress/Problems:  Monica MorrowChelsea Fischer Monica Fischer 10/07/2016, 9:29 PM

## 2016-10-07 NOTE — Progress Notes (Signed)
[facility] Patient participated when in the group. Patient did not stay for the length of group.   10/07/16 1400  Clinical Encounter Type  Visited With Patient;Health care provider;Other (Comment)  Visit Type Spiritual support;Follow-up  Referral From Patient (Group)

## 2016-10-07 NOTE — Progress Notes (Signed)
Pt visible on the periphery of the unit.  Upon approach states her day was better then previous days.States she enjoyed some of the groups today.  Pt states she feels ok about going home with her parents briefly before she goes to another facility.  Pt denies s/i, h/i or hallucinations.  Pt states she is working on figuring out what is delusional thinking and real.  Pt was medication compliant and mouth check was done. Pt's boyfriend asked to speak with the nurse and reports he has a lot of concern about the pt.  He feels she is not being truthful about feeling comfortable with her parents and going home with them.  He reports he went to the police about what he could do to help her.  He reports this was not helpful and they said no one would believe her due to her dx. He was encouraged to speak with SW tomorrow as he asked about this.  Pt maintained safety while monitored on 15 minute safety checks.

## 2016-10-07 NOTE — Progress Notes (Signed)
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 facility in [place], [place]. Apparently now the patient is likely to be transferred directly from our unit to hopefully on Tuesday of next week. She has a telephone interview with them on Monday.  F[facility]u: [facility]   Hildred Priest, MD 10/07/2016, 1:02 PM

## 2016-10-07 NOTE — Plan of Care (Signed)
Problem: Activity: Goal: Interest or engagement in activities will improve Outcome: Progressing Improved participation  Problem: Education: Goal: Emotional status will improve Outcome: Progressing Slightly improved mood

## 2016-10-07 NOTE — Progress Notes (Signed)
Recreation Therapy Notes  Date: 08.03.18 Time: 1:00 pm Location: Craft Room  Group Topic: Coping Skills  Goal Area(s) Addresses:  Patient will verbalize benefit of using art as a coping skill. Patient will verbalize one emotion experienced in group.  Behavioral Response: Did not attend  Intervention: Art  Activity: Patients were given coloring sheets, paper to draw, and connect the dot sheets and were instructed to complete the activity while thinking about the emotions they were feeling as well as what their minds were focused on.  Education: LRT educated patients on healthy coping skills.  Education Outcome: Patient did not attend group.  Clinical Observations/Feedback: Patient did not attend group.  Arnett Galindez M, LRT/CTRS 10/07/2016 1:51 PM 

## 2016-10-07 NOTE — [facility] Group Notes (Signed)
[facility] LCSW Group Therapy   10/07/2016 9:30 AM   Type of Therapy: Group Therapy   Participation Level: Patient invited but did not attend.    Leighla Chestnutt, MSW, LCSW-A 10/07/2016, 10:45AM  

## 2016-10-07 NOTE — Progress Notes (Signed)
D: Patient stated slept good last night .Stated appetite is fair and energy level  Is normal. Stated concentration is good . Stated on Depression scale , 7 hopeless 5 and anxiety7 .( low 0-10 high) Denies suicidal  homicidal ideations  . No auditory hallucinations  No pain concerns . Appropriate ADL'S. Interacting with peers and staff. Working on Relax and coping  Patient visting with parents  A: Encourage patient participation with unit programming . Instruction  Given on  Medication , verbalize understanding. R: Voice no other concerns. Staff continue to monitor

## 2016-10-07 NOTE — Progress Notes (Signed)
Edgardo RoysGreta was cooperative on shift with treatment. She interacted well with peers and staff, she was medication compliant. She did attend the group and participated. She reports that mood has improved. she is currently in bed resting eyes closed at this time, will continue to monitor. No behavioral issues to report on shift at this time.

## 2016-10-07 NOTE — Tx Team (Signed)
Interdisciplinary Treatment and Diagnostic Plan Update  10/07/2016 Time of Session: [address] [name] MRN: [id number]  Principal Diagnosis: Delusional disorder [facility](HCC)  Secondary Diagnoses: Principal Problem:   Delusional disorder (HCC) Active Problems:   B12 deficiency   Depressive disorder   Current Medications:  Current Facility-Administered Medications  Medication Dose Route Frequency Provider Last Rate Last Dose  . acetaminophen (TYLENOL) tablet 650 mg  650 mg Oral Q6H PRN Clapacs, Jackquline DenmarkJohn T, MD   650 mg at 09/25/16 1419  . alum & mag hydroxide-simeth (MAALOX/MYLANTA) 200-200-20 MG/5ML suspension 30 mL  30 mL Oral Q4H PRN Clapacs, John T, MD      . ibuprofen (ADVIL,MOTRIN) tablet 600 mg  600 mg Oral Q6H PRN Darliss RidgelKapur, Aarti K, MD   600 mg at 09/25/16 2243  . magnesium hydroxide (MILK OF MAGNESIA) suspension 30 mL  30 mL Oral Daily PRN Clapacs, John T, MD      . neomycin-bacitracin-polymyxin (NEOSPORIN) ointment   Topical BID Clapacs, John T, MD      . OLANZapine zydis (ZYPREXA) disintegrating tablet 10 mg  10 mg Oral QHS Jimmy FootmanHernandez-Gonzalez, Andrea, MD   10 mg at 10/06/16 2300   PTA Medications: No prescriptions prior to admission.    Patient Stressors: Traumatic event Other: Fixed Delusional Belief: "I think I am confused, my mind is playing tricks on me.."  Patient Strengths: Ability for insight Average or above average intelligence Capable of independent living Motivation for treatment/growth Supportive family/friends  Treatment Modalities: Medication Management, Group therapy, Case management,  1 to 1 session with clinician, Psychoeducation, Recreational therapy.   Physician Treatment Plan for Primary Diagnosis: Delusional disorder [facility](HCC) Long Term Goal(s): Improvement in symptoms so as ready for discharge Improvement in symptoms so as ready for discharge   Short Term Goals: Ability to identify changes in lifestyle to reduce recurrence of condition will improve Ability  to verbalize feelings will improve Ability to demonstrate self-control will improve Ability to identify and develop effective coping behaviors will improve Ability to identify triggers associated with substance abuse/mental health issues will improve  Medication Management: Evaluate patient's response, side effects, and tolerance of medication regimen.  Therapeutic Interventions: 1 to 1 sessions, Unit Group sessions and Medication administration.  Evaluation of Outcomes: Adequate for Discharge  Physician Treatment Plan for Secondary Diagnosis: Principal Problem:   Delusional disorder (HCC) Active Problems:   B12 deficiency   Depressive disorder  Long Term Goal(s): Improvement in symptoms so as ready for discharge Improvement in symptoms so as ready for discharge   Short Term Goals: Ability to identify changes in lifestyle to reduce recurrence of condition will improve Ability to verbalize feelings will improve Ability to demonstrate self-control will improve Ability to identify and develop effective coping behaviors will improve Ability to identify triggers associated with substance abuse/mental health issues will improve     Medication Management: Evaluate patient's response, side effects, and tolerance of medication regimen.  Therapeutic Interventions: 1 to 1 sessions, Unit Group sessions and Medication administration.  Evaluation of Outcomes: Adequate for Discharge   RN Treatment Plan for Primary Diagnosis: Delusional disorder [facility](HCC) Long Term Goal(s): Knowledge of disease and therapeutic regimen to maintain health will improve  Short Term Goals: Ability to identify and develop effective coping behaviors will improve and Compliance with prescribed medications will improve  Medication Management: RN will administer medications as ordered by provider, will assess and evaluate patient's response and provide education to patient for prescribed medication. RN will report any  adverse and/or side effects to  prescribing provider.  Therapeutic Interventions: 1 on 1 counseling sessions, Psychoeducation, Medication administration, Evaluate responses to treatment, Monitor vital signs and CBGs as ordered, Perform/monitor CIWA, COWS, AIMS and Fall Risk screenings as ordered, Perform wound care treatments as ordered.  Evaluation of Outcomes: Adequate for Discharge   LCSW Treatment Plan for Primary Diagnosis: Delusional disorder [facility](HCC) Long Term Goal(s): Safe transition to appropriate next level of care at discharge, Engage patient in therapeutic group addressing interpersonal concerns.  Short Term Goals: Engage patient in aftercare planning with referrals and resources, Increase social support, Facilitate acceptance of mental health diagnosis and concerns and Increase skills for wellness and recovery  Therapeutic Interventions: Assess for all discharge needs, 1 to 1 time with Social worker, Explore available resources and support systems, Assess for adequacy in community support network, Educate family and significant other(s) on suicide prevention, Complete Psychosocial Assessment, Interpersonal group therapy.  Evaluation of Outcomes: Progressing   Progress in Treatment: Attending groups: Yes. Participating in groups: Yes. Taking medication as prescribed: No. Toleration medication: Yes. Family/Significant other contact made: No, will contact:  when given permission Patient understands diagnosis: No. Discussing patient identified problems/goals with staff: Yes. Medical problems stabilized or resolved: Yes. Denies suicidal/homicidal ideation: Yes. Issues/concerns per patient self-inventory: No. Other: none  New problem(s) identified: No, Describe:  none  New Short Term/Long Term Goal(s): Pt goal: "I don't really know."  Discharge Plan or Barriers: Pt will be transferring to longer term residential treatment at [facility] in [place].  Reason for Continuation of  Hospitalization: Delusions  Medication stabilization  Estimated Length of Stay: 4 days.  Attendees: Patient: 10/07/2016   Physician: Dr. Ardyth HarpsHernandez, MD 10/07/2016   Nursing:  10/07/2016   RN Care Manager: 10/07/2016   Social Worker: Daleen SquibbGreg Enola Siebers, LCSW 10/07/2016   Recreational Therapist: Hershal CoriaBeth Greene, LRT/CTRS  10/07/2016   Other: Arther Abbotthristine Yarbrough, chaplain 10/07/2016   Other:  10/07/2016   Other: 10/07/2016                Scribe for Treatment Team: Lorri FrederickWierda, Erynn Vaca Jon, LCSW 10/07/2016 1:18 PM

## 2016-10-08 NOTE — Progress Notes (Addendum)
Pt alert and oriented x 4. Denies SI, HI or A/V hallucinations. Pt denies depression at this time. Pt mood is pleasant. Pt med compliant. No distress noted. 15 min safety checks continue. Pt slept 5.75 hrs.

## 2016-10-08 NOTE — Plan of Care (Signed)
Problem: Safety: Goal: Periods of time without injury will increase Outcome: Progressing Patient remains safe and without injury during hospitalization and on Q 15 minute observations. Will continue to monitor patient.   
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
## 2016-10-08 NOTE — Progress Notes (Signed)
Patient was withdrawn to room most of shift and kept to self. Patient did come out for meals and snack, and talked on the phone. Patient is alert and oriented x 4, breathing unlabored, and extremities x 4 within normal limits. Patient is calm and cooperative. Patient did not display any disruptive behavior.Patient continues to be monitored on 15 minute safety checks. Will continue to monitor patient and notify MD of any changes.

## 2016-10-08 NOTE — [facility] Group Notes (Signed)
[facility] LCSW Group Therapy  10/08/2016 2:01 PM  Type of Therapy:  Group Therapy  Participation Level:  Patient did not attend group. CSW invited patient to group.   Summary of Progress/Problems: Stress management: Patients defined and discussed the topic of stress and the related symptoms and triggers for stress. Patients identified healthy coping skills they would like to try during hospitalization and after discharge to manage stress in a healthy way. CSW offered insight to varying stress management techniques.   Jada Kuhnert G. Garnette CzechSampson MSW, LCSWA 10/08/2016, 2:05 PM

## 2016-10-08 NOTE — Plan of Care (Signed)
Problem: Coping: Goal: Ability to verbalize frustrations and anger appropriately will improve Outcome: Progressing Pt encouraged to verbalize all needs to staff.

## 2016-10-09 NOTE — Plan of Care (Signed)
Problem: Safety: Goal: Periods of time without injury will increase Outcome: Progressing Patient remains safe and without injury during hospitalization and on Q 15 minute observation.    

## 2016-10-09 NOTE — Progress Notes (Addendum)
Pt very pleasant. Denies SI, HI or A/V hallucinations. Pt denies depression at the present time. Pt was isolative, she stated in room the whole shift. Pt was compliant with medications, no adverse effects noted. 15 min safety checks will continue. Pt slept 7.5 hrs.

## 2016-10-09 NOTE — [facility] Group Notes (Signed)
[facility] LCSW Group Therapy  10/09/2016 2:59 PM  Type of Therapy:  Group Therapy  Participation Level:  Patient did not attend group. CSW invited patient to group.   Summary of Progress/Problems:  Monica Fischer MSW, LCSWA 10/09/2016, 2:59 PM

## 2016-10-09 NOTE — Plan of Care (Signed)
Problem: Activity: Goal: Sleeping patterns will improve Outcome: Progressing Pt will get 6 or more hours of sleep this shift.
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
## 2016-10-09 NOTE — Progress Notes (Signed)
Patient was withdrawn to room and was very minimal in her conversation. Patient denies any suicidal or homicidal ideations. Patient at times appears anxious. Patient is alert and oriented x 4, breathing unlabored, and extremities x 4 within normal limits. Patient is calm and cooperative. Patient did not display any disruptive behavior. Patient continues to be monitored on 15 minute safety checks. Will continue to monitor patient and notify MD of any changes.
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
## 2016-10-10 MED ORDER — CYANOCOBALAMIN 1000 MCG/ML IJ SOLN
1000.0000 ug | INTRAMUSCULAR | Status: DC
  Administered 2016-10-10: 1000 ug via SUBCUTANEOUS
  Filled 2016-10-10: qty 1

## 2016-10-10 MED ORDER — PERMETHRIN 5 % EX CREA
TOPICAL_CREAM | Freq: Once | CUTANEOUS | Status: AC
  Administered 2016-10-10: 1 via TOPICAL
  Filled 2016-10-10 (×2): qty 60

## 2016-10-10 MED ORDER — CYANOCOBALAMIN 1000 MCG/ML IJ SOLN: 1000.0000 ug | INTRAMUSCULAR | 0 refills | Status: AC

## 2016-10-10 MED ORDER — OLANZAPINE 10 MG PO TBDP: 10.0000 mg | ORAL_TABLET | Freq: Every day | ORAL | 0 refills | Status: AC

## 2016-10-10 NOTE — Progress Notes (Signed)
Pt calm and cooperative. She denies current SI, HI, a/v hallucinations. Will continue to monitor.

## 2016-10-10 NOTE — Progress Notes (Signed)
   10/10/16 1810  Clinical Encounter Type  Visited With Patient not available;Health care provider  Visit Type Behavioral Health  Referral [facility] Nurse  [facility] to let them know that patient could not have visitors this evening.

## 2016-10-10 NOTE — Discharge Summary (Signed)
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 movements (  highest score from questions above): None, normal Incapacitation due to abnormal movements: None, normal Patient's awareness of abnormal movements (rate only patient's report): No Awareness, Dental Status Current problems with teeth and/or dentures?: No Does patient usually wear dentures?: No  CIWA:    COWS:     Musculoskeletal: Strength & Muscle Tone: within normal limits Gait & Station: normal Patient leans: N/A  Psychiatric Specialty Exam: Physical Exam  Constitutional: She is oriented to person, place, and time. She appears well-developed and well-nourished.  HENT:  Head: Normocephalic and atraumatic.  Eyes: Conjunctivae and EOM are normal.  Neck: Normal range of motion.  Respiratory: Effort normal.  Musculoskeletal: Normal range of motion.  Neurological: She is alert and oriented to person, place, and time.    Review of Systems  Constitutional: Negative.   HENT: Negative.   Eyes: Negative.   Respiratory: Negative.   Cardiovascular: Negative.   Gastrointestinal: Negative.   Genitourinary: Negative.    Musculoskeletal: Negative.   Skin: Negative.   Neurological: Negative.   Endo/Heme/Allergies: Negative.   Psychiatric/Behavioral: Negative for depression, hallucinations, memory loss, substance abuse and suicidal ideas. The patient is nervous/anxious. The patient does not have insomnia.     Blood pressure 119/77, pulse 80, temperature 98.6 F (37 C), resp. rate 18, height 5' 7"  (1.702 m), weight 55.8 kg (123 lb), SpO2 100 %.Body mass index is 19.26 kg/m.  General Appearance: Well Groomed  Eye Contact:  Good  Speech:  Clear and Coherent  Volume:  Normal  Mood:  Anxious  Affect:  Appropriate and Congruent  Thought Process:  Linear and Descriptions of Associations: Intact  Orientation:  Full (Time, Place, and Person)  Thought Content:  Delusions  Suicidal Thoughts:  No  Homicidal Thoughts:  No  Memory:  Immediate;   Good Recent;   Good Remote;   Good  Judgement:  Fair  Insight:  Shallow  Psychomotor Activity:  Normal  Concentration:  Concentration: Good and Attention Span: Good  Recall:  Good  Fund of Knowledge:  Good  Language:  Good  Akathisia:  No  Handed:    AIMS (if indicated):     Assets:  Communication Skills Social Support  ADL's:  Intact  Cognition:  WNL  Sleep:  Number of Hours: 7.75     Have you used any form of tobacco in the last 30 days? (Cigarettes, Smokeless Tobacco, Cigars, and/or Pipes): No  Has this patient used any form of tobacco in the last 30 days? (Cigarettes, Smokeless Tobacco, Cigars, and/or Pipes) Yes, No  Blood Alcohol level:  Lab Results  Component Value Date   ETH <5 10/62/6948    Metabolic Disorder Labs:  Lab Results  Component Value Date   HGBA1C 4.8 09/22/2016   MPG 91 09/22/2016   No results found for: PROLACTIN Lab Results  Component Value Date   CHOL 124 09/22/2016   TRIG 57 09/22/2016   HDL 48 09/22/2016   CHOLHDL 2.6 09/22/2016   VLDL 11 09/22/2016   LDLCALC 65 09/22/2016   Results for ARIEAL, CUOCO (MRN  [id number]) as of 10/10/2016 15:01  Ref. Range 09/20/2016 20:12 09/20/2016 20:23 09/21/2016 07:55 09/22/2016 12:32 09/22/2016 13:24 09/22/2016 13:26 09/22/2016 13:28 09/24/2016 11:34 09/24/2016 15:37  COMPREHENSIVE METABOLIC PANEL Unknown Rpt (A)          Sodium Latest Ref Range: 135 - 145 mmol/L 138          Potassium Latest Ref Range: 3.5 - 5.1 mmol/L 3.9  Chloride Latest Ref Range: 101 - 111 mmol/L 104          CO2 Latest Ref Range: 22 - 32 mmol/L 24          Glucose Latest Ref Range: 65 - 99 mg/dL 112 (H)          Mean Plasma Glucose Latest Units: mg/dL     91      BUN Latest Ref Range: 6 - 20 mg/dL 13          Creatinine Latest Ref Range: 0.44 - 1.00 mg/dL 0.74          Calcium Latest Ref Range: 8.9 - 10.3 mg/dL 9.6          Anion gap Latest Ref Range: 5 - 15  10          Alkaline Phosphatase Latest Ref Range: 38 - 126 U/L 53          Albumin Latest Ref Range: 3.5 - 5.0 g/dL 5.0          AST Latest Ref Range: 15 - 41 U/L 25          ALT Latest Ref Range: 14 - 54 U/L 18          Total Protein Latest Ref Range: 6.5 - 8.1 g/dL 7.8          Ammonia Latest Ref Range: 9 - 35 umol/L      <9 (L)     Total Bilirubin Latest Ref Range: 0.3 - 1.2 mg/dL 1.2          DISCLAIMER Unknown       Comment    GFR, Est African American Latest Ref Range: >60 mL/min >60          GFR, Est Non African American Latest Ref Range: >60 mL/min >60          Total CHOL/HDL Ratio Latest Units: RATIO     2.6      Cholesterol Latest Ref Range: 0 - 200 mg/dL     124      HDL Cholesterol Latest Ref Range: >40 mg/dL     48      LDL (calc) Latest Ref Range: 0 - 99 mg/dL     65      Triglycerides Latest Ref Range: <150 mg/dL     57      VLDL Latest Ref Range: 0 - 40 mg/dL     11      Methylmalonic Acid, Quantitative Latest Ref Range: 0 - 378 nmol/L       100    WBC Latest Ref Range: 3.6 - 11.0 K/uL 15.8 (H)       10.7   RBC Latest Ref Range: 3.80 - 5.20 MIL/uL 4.93       4.87   Hemoglobin Latest Ref Range: 12.0 - 16.0 g/dL  14.7       14.7   HCT Latest Ref Range: 35.0 - 47.0 % 43.6       43.2   MCV Latest Ref Range: 80.0 - 100.0 fL 88.6       88.7   MCH Latest Ref Range: 26.0 - 34.0 pg 29.8       30.2   MCHC Latest Ref Range: 32.0 - 36.0 g/dL 33.6       34.1   RDW Latest Ref Range: 11.5 - 14.5 % 12.7       12.7   Platelets Latest Ref Range: 150 -  440 K/uL 264       240   Neutrophils Latest Units: %        78   Lymphocytes Latest Units: %        15   Monocytes Relative Latest Units: %        6   Eosinophil Latest Units: %        1   Basophil Latest Units: %        0   NEUT# Latest Ref Range: 1.4 - 6.5 K/uL        8.3 (H)   Lymphocyte # Latest Ref Range: 1.0 - 3.6 K/uL        1.6   Monocyte # Latest Ref Range: 0.2 - 0.9 K/uL        0.7   Eosinophils Absolute Latest Ref Range: 0 - 0.7 K/uL        0.1   Basophils Absolute Latest Ref Range: 0 - 0.1 K/uL        0.0   Acetaminophen (Tylenol), S Latest Ref Range: 10 - 30 ug/mL <16 (L)          Salicylate Lvl Latest Ref Range: 2.8 - 30.0 mg/dL <7.0          Hemoglobin A1C Latest Ref Range: 4.8 - 5.6 %     4.8      Preg Test, Ur Latest Ref Range: NEGATIVE   NEGATIVE         TSH Latest Ref Range: 0.350 - 4.500 uIU/mL     0.696      Specimen source GC/Chlam Unknown   ENDOCERVICAL        Chlamydia Tr Latest Ref Range: NOT DETECTED    NOT DETECTED        N gonorrhoeae Latest Ref Range: NOT DETECTED    NOT DETECTED        RPR Latest Ref Range: Non Reactive        Non Reactive    Yeast Wet Prep HPF POC Latest Ref Range: NONE SEEN    NONE SEEN        Trich, Wet Prep Latest Ref Range: NONE SEEN    NONE SEEN        Clue Cells Wet Prep HPF POC Latest Ref Range: NONE SEEN    NONE SEEN        WBC, Wet Prep HPF POC Latest Ref Range: NONE SEEN    MANY (A)        HIV 1/2 Antibodies Latest Ref Range: NON REACTIVE        NON REACTIVE    Interpretation (HIV Ag Ab) Unknown       A non reactive te...    HIV-1 P24 Antigen - HIV24 Latest Ref Range: NON REACTIVE        NON REACTIVE     Appearance Latest Ref Range: CLEAR  HAZY (A)        HAZY (A)  Bacteria, UA Latest Ref Range: NONE SEEN  RARE (A)        RARE (A)  Bilirubin Urine Latest Ref Range: NEGATIVE  NEGATIVE        NEGATIVE  Color, Urine Latest Ref Range: YELLOW  YELLOW (A)        STRAW (A)  Glucose Latest Ref Range: NEGATIVE mg/dL NEGATIVE        50 (A)  Hgb urine dipstick Latest Ref Range: NEGATIVE  NEGATIVE        NEGATIVE  Hyaline Casts, UA Unknown PRESENT          Ketones, ur Latest Ref Range: NEGATIVE mg/dL 5 (A)        NEGATIVE  Leukocytes, UA Latest Ref Range: NEGATIVE  MODERATE (A)        LARGE (A)  Mucous Unknown PRESENT          Nitrite Latest Ref Range: NEGATIVE  NEGATIVE        NEGATIVE  pH Latest Ref Range: 5.0 - 8.0  5.0        7.0  Protein Latest Ref Range: NEGATIVE mg/dL NEGATIVE        NEGATIVE  RBC / HPF Latest Ref Range: 0 - 5 RBC/hpf 0-5        0-5  Specific Gravity, Urine Latest Ref Range: 1.005 - 1.030  1.008        1.005  Squamous Epithelial / LPF Latest Ref Range: NONE SEEN  6-30 (A)        0-5 (A)  WBC, UA Latest Ref Range: 0 - 5 WBC/hpf 6-30        6-30  Alcohol, Ethyl (B) Latest Ref Range: <5 mg/dL <5          Amphetamines, Ur Screen Latest Ref Range: NONE DETECTED  NONE DETECTED          Barbiturates, Ur Screen Latest Ref Range: NONE DETECTED  NONE DETECTED          Benzodiazepine, Ur Scrn Latest Ref Range: NONE DETECTED  NONE DETECTED          Cocaine Metabolite,Ur [place] Latest Ref Range: NONE DETECTED  NONE DETECTED          Methadone Scn, Ur Latest Ref Range: NONE DETECTED  NONE DETECTED          MDMA (Ecstasy)Ur Screen Latest Ref Range: NONE DETECTED  NONE DETECTED          Cannabinoid 50 Ng, Ur  Latest Ref Range: NONE DETECTED  NONE DETECTED          Opiate, Ur Screen Latest Ref Range: NONE DETECTED  NONE DETECTED          Phencyclidine (PCP) Ur S Latest Ref Range: NONE DETECTED  NONE DETECTED          Tricyclic, Ur Screen Latest Ref Range: NONE DETECTED  NONE DETECTED           URINE CULTURE Unknown Rpt (A)          WET PREP, GENITAL Unknown   Rpt (A)         CLINICAL DATA:  Altered mental status.  Psychosis.  EXAM: CT HEAD WITHOUT CONTRAST  TECHNIQUE: Contiguous axial images were obtained from the base of the skull through the vertex without intravenous contrast.  COMPARISON:  None.  FINDINGS: Brain: No evidence of acute infarction, hemorrhage, hydrocephalus, extra-axial collection or mass lesion/mass effect.  Vascular: Normal.  Skull: Intact.  Sinuses/Orbits: Negative.  Other: None.  IMPRESSION: Normal head CT.   See Psychiatric Specialty Exam and Suicide Risk Assessment completed by Attending Physician prior to discharge.  Discharge destination:  Other:  [facility]  Is patient on multiple antipsychotic therapies at discharge:  No   Has Patient had three or more failed trials of antipsychotic monotherapy by history:  No  Recommended Plan for Multiple Antipsychotic Therapies: NA   Allergies as of 10/10/2016   No Known Allergies     Medication List    TAKE these medications  Indication  cyanocobalamin 1000 MCG/ML injection Commonly known as:  (VITAMIN B-12) Inject 1 mL (1,000 mcg total) into the skin once a week. Next dose on the 8/13  Indication:  Inadequate Vitamin B12   OLANZapine zydis 10 MG disintegrating tablet Commonly known as:  ZYPREXA Take 1 tablet (10 mg total) by mouth daily.  Indication:  Major Depressive Disorder      Follow-up Information    [facility] on 10/11/2016.   Why:  Please arrive to [facility] by 11AM for your appointment. Have your discharge paperwork with you. Contact agency with questions or concerns. Contact information: Address: [address] [place], [place] [address] Ph#: [phone]          >30 minutes. >50 % of the time was spent in coordination of care.  Signed: Hildred Priest, MD 10/10/2016, 2:59 PM

## 2016-10-10 NOTE — [facility] Suicide Risk Assessment (Signed)
[facility]   Principal Problem: Delusional disorder [facility](HCC) Discharge Diagnoses:  Patient Active Problem List   Diagnosis Date Noted  . B12 deficiency [E53.8] 10/03/2016  . Depressive disorder [F32.9] 10/03/2016  . Delusional disorder (HCC) [F22] 09/21/2016     Psychiatric Specialty Exam: ROS  Blood pressure 119/77, pulse 80, temperature 98.6 F (37 C), resp. rate 18, height 5\' 7"  (1.702 m), weight 55.8 kg (123 lb), SpO2 100 %.Body mass index is 19.26 kg/m.                                                       Mental Status Per Nursing Assessment::   On Admission:     Demographic Factors:  Adolescent or young adult and Caucasian  Loss Factors: Decrease in vocational status  Historical Factors: Impulsivity  Risk Reduction Factors:   Sense of responsibility to family, Living with another person, especially a relative and Positive social support  Continued Clinical Symptoms:  More than one psychiatric diagnosis Currently Psychotic  Cognitive Features That Contribute To Risk:  Polarized thinking    Suicide Risk:  Minimal: No identifiable suicidal ideation.  Patients presenting with no risk factors but with morbid ruminations; may be classified as minimal risk based on the severity of the depressive symptoms  Follow-up Information    [facility] on 10/11/2016.   Why:  [facility]. Have your discharge paperwork with you. Contact agency with questions or concerns. Contact information: Address: [address] [place], [place] [address] Ph#: [phone]           Jimmy FootmanHernandez-Gonzalez,  Roshawnda Pecora, MD 10/10/2016, 2:57 PM

## 2016-10-10 NOTE — Progress Notes (Signed)
Recreation Therapy Notes  Date: 08.06.18 Time: 9:30 am Location: Craft Room  Group Topic: Self-expression  Goal Area(s) Addresses:  Patient will effectively use art as a means of self-expression. Patient will recognize positive benefit of self-expression. Patient will be able to identify one emotion experienced during group session. Patient will identify use of art as a coping skill.  Behavioral Response: Did not attend  Intervention: Two Faces of Me  Activity: Patients were given a blank face worksheet and were instructed to draw a line down the middle. On one side, they were instructed to draw or write how they felt when they were admitted. On the other side, they were instructed to draw or write how they want to feel when they get d/c.  Education: LRT educated patients on other forms of self-expression.  Education Outcome: Patient did not attend group.  Clinical Observations/Feedback: Patient did not attend group.  Brandy Zuba M, LRT/CTRS 10/10/2016 9:49 AM 

## 2016-10-10 NOTE — Plan of Care (Signed)
Problem: Safety: Goal: Periods of time without injury will increase Outcome: Progressing Pt remains safe while in hospital injury free.    

## 2016-10-10 NOTE — Progress Notes (Signed)
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 boyfriend and also with the patient's mother. I have spoken with both parents multiple times on the phone.  Hospitalization status continue involuntary commitment  Precautions every 15 minute checks  Vital signs daily  Diet: Regular  Disposition: Referral has been made to residential facility in [place], [place]. Phone interview completed today.  We contacted them and they confirm admission is schedule for tomorrow at 11am  F/u: [facility]   Pt is to be discharge at 8 am tomorrow.  Family will drive her to [facility].  Hildred Priest, MD 10/10/2016, 2:26 PM

## 2016-10-10 NOTE — [facility] Group Notes (Signed)
[facility] LCSW Group Therapy Note  Date/Time: 10/10/16, 1300  Type of Therapy and Topic:  Group Therapy:  Overcoming Obstacles  Participation Level:  active  Description of Group:    In this group patients will be encouraged to explore what they see as obstacles to their own wellness and recovery. They will be guided to discuss their thoughts, feelings, and behaviors related to these obstacles. The group will process together ways to cope with barriers, with attention given to specific choices patients can make. Each patient will be challenged to identify changes they are motivated to make in order to overcome their obstacles. This group will be process-oriented, with patients participating in exploration of their own experiences as well as giving and receiving support and challenge from other group members.  Therapeutic Goals: 1. Patient will identify personal and current obstacles as they relate to admission. 2. Patient will identify barriers that currently interfere with their wellness or overcoming obstacles.  3. Patient will identify feelings, thought process and behaviors related to these barriers. 4. Patient will identify two changes they are willing to make to overcome these obstacles:    Summary of Patient Progress: Pt was initially the only group member, however, a second group member showed up later.  Pt identified mental health as her current obstacle and discussed how she is taking a medical withdrawal from college to work on her current mental health issues.        Therapeutic Modalities:   Cognitive Behavioral Therapy Solution Focused Therapy Motivational Interviewing Relapse Prevention Therapy  Daleen SquibbGreg Truth Barot, LCSW

## 2016-10-10 NOTE — Progress Notes (Signed)
CSW [facility] from [facility].  Pt has been back and forth with regards to her communications with her parents all weekend: Saturday she told them that she believes them that they did not abuse her, Sunday she told them she doesn't believe them at all.  She has told them she is spitting out her medicine.  She has also done two phone sessions with Candise BowensJen, the energy healer, while [facility] and her boyfriend is paying the lady for the sessions.  They will speak with [facility] after the phone interview and confirm that things are all set for tomorrow. Garner NashGregory Jamen Loiseau, MSW, LCSW Clinical Social Worker 10/10/2016 10:38 AM

## 2016-10-10 NOTE — Progress Notes (Signed)
RN saw live nit crawling on pt head. Two RN verified pt has active head lice. Pt placed on contact precautions. MD notified ordered permethrin cream.

## 2016-10-10 NOTE — Plan of Care (Signed)
Problem: Safety: Goal: Ability to remain free from injury will improve Outcome: Progressing Pt remains safe while in hospital injury free.    

## 2016-10-11 NOTE — Progress Notes (Signed)
D: Patient is aware of  Discharge this shift .Patient denies suicidal /homicidal ideations. Patient received all belongings brought in  A: No Storage medications. Writer reviewed Discharge Summary, Suicide Risk Assessment, and Transitional Record. Patient also received Prescriptions   from  MD. . Aware  Of follow up appointment . Hesitant on discharge  R: Patient left unit with no questions  Or concerns  With parents   And boyfriend

## 2016-10-11 NOTE — [facility] Group Notes (Signed)
[facility] Group Notes:  (Nursing/MHT/Case Management/Adjunct)  Date:  10/11/2016  Time:  7:14 AM  Type of Therapy:  Group Therapy  Participation Level:  Did Not Attend, pt is on restriction due to lice    Gunnar BullaYASMINE W [facility] 10/11/2016, 7:14 AM

## 2016-10-11 NOTE — Progress Notes (Signed)
Patient received treatment for head lice.  Writer spoke with Dr Ardyth HarpsHernandez.  Patient will discharge this morning at 8am.  Writer spoke with patien't parents to inform them that patient will still discharge this AM.

## 2016-10-11 NOTE — Progress Notes (Signed)
  [facility] :  Will you be returning to the same living situation after discharge:  No. At discharge, do you have transportation home?: Yes,  parents Do you have the ability to pay for your medications: Yes,  [employer]  Release of information consent forms completed and in the chart;  Patient's signature needed at discharge.  Patient to Follow up at: Follow-up Information    [facility] Go on 10/11/2016.   Why:  [facility] for your appointment. Have your discharge paperwork with you. Contact agency with questions or concerns. Contact information: Address: [address] [place], [place] [address] Ph#: [phone]           Next level of care provider [facility] Link:no  Safety Planning and Suicide Prevention discussed: Yes,  SPE completed with patient  Have you used any form of tobacco in the last 30 days? (Cigarettes, Smokeless Tobacco, Cigars, and/or Pipes): No  Has patient been referred to the Quitline?: N/A patient is not a smoker  Patient has been referred for addiction treatment: N/A  Lynden OxfordKadijah R Emoree Sasaki, LCSWA 10/11/2016, 8:39 AM

## 2018-10-02 IMAGING — CT CT HEAD W/O CM
3 series · 16 of 47 positions shown, 19 images · non-contrast
Comparison: None.

CLINICAL DATA: Altered mental status.  Psychosis.

EXAM:
CT HEAD WITHOUT CONTRAST
TECHNIQUE: Contiguous axial images were obtained from the base of the skull
through the vertex without intravenous contrast.

[Series 2: head wo · axial · 0.40mm/px · z∈[-139,-14]mm · 10 of 30 slices shown, 13 images]
[im 3/30  brain]
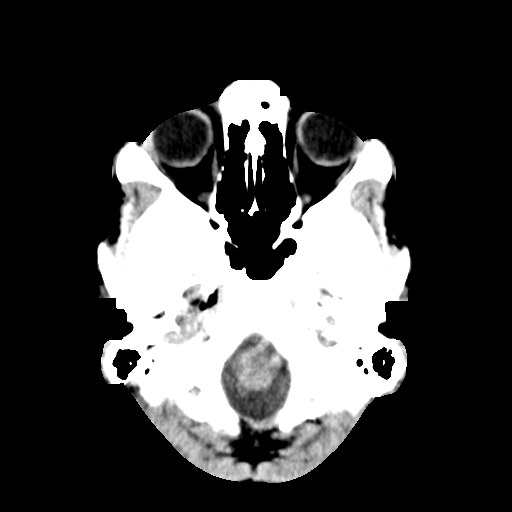
[im 3/30  bone]
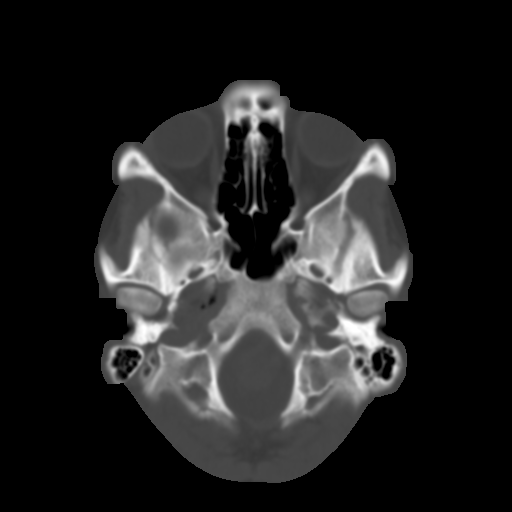
[im 6/30  brain]
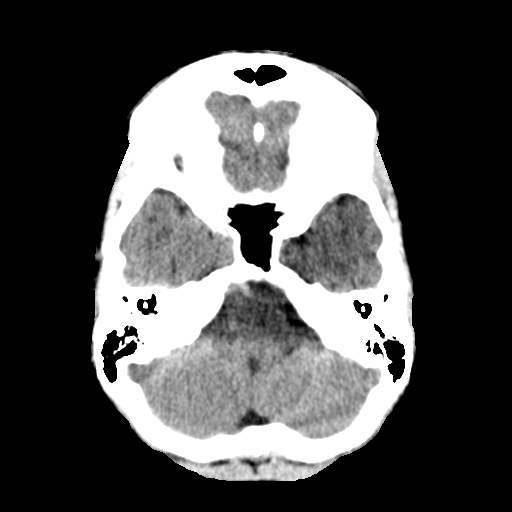
[im 9/30  brain]
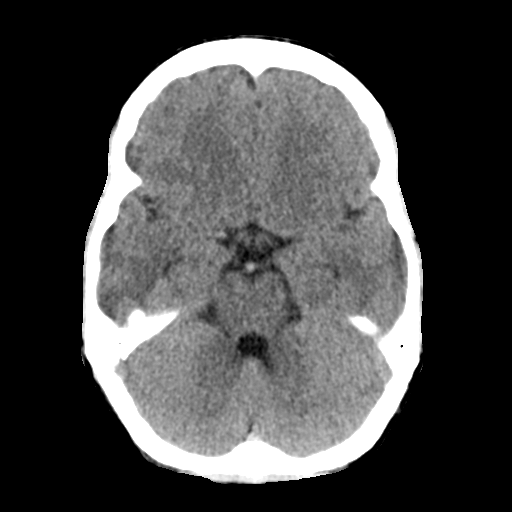
[im 11/30  brain]
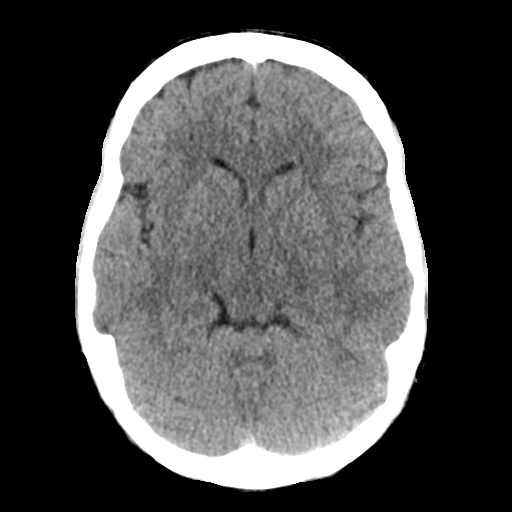
[im 14/30  brain]
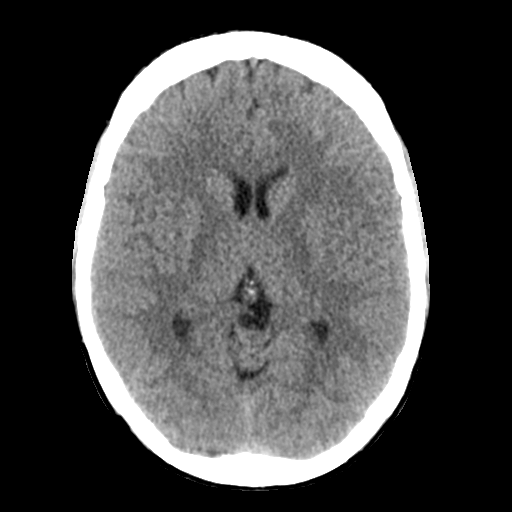
[im 14/30  bone]
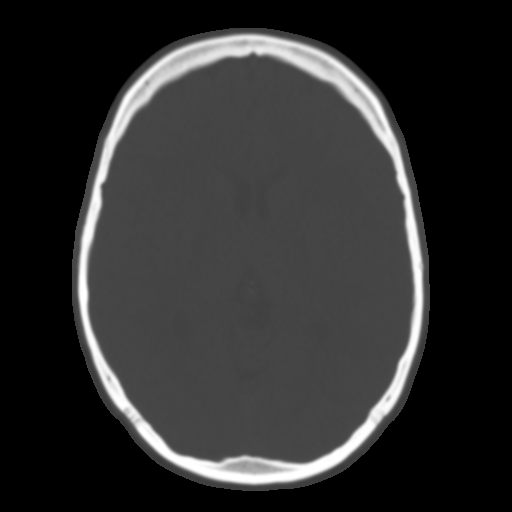
[im 17/30  brain]
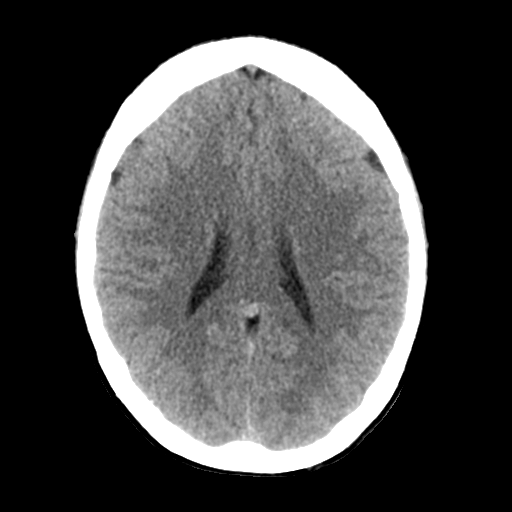
[im 20/30  brain]
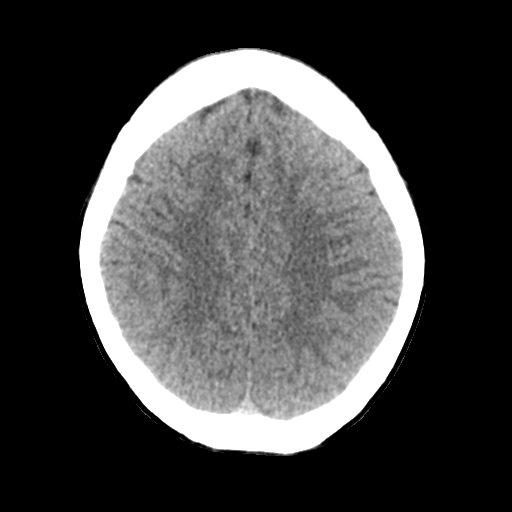
[im 23/30  brain]
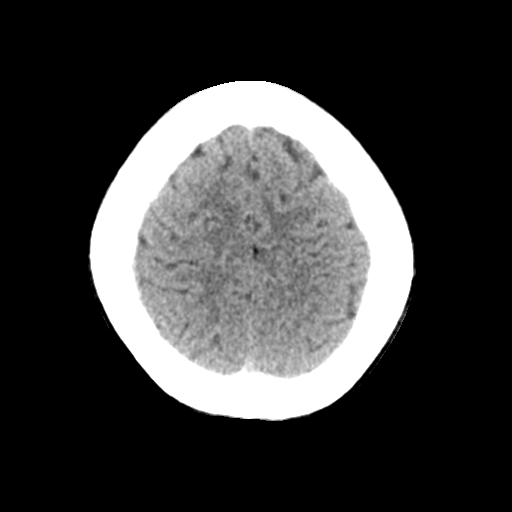
[im 25/30  brain]
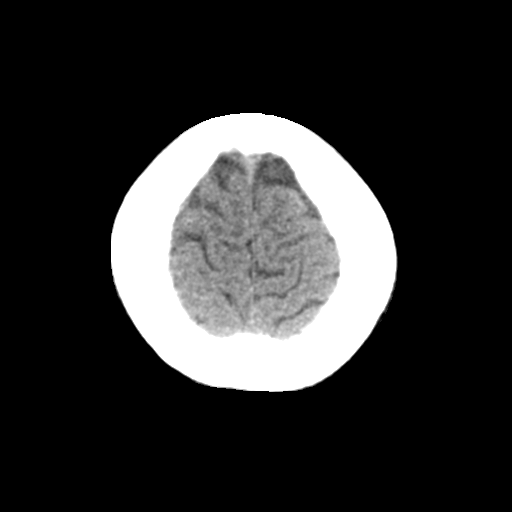
[im 25/30  bone]
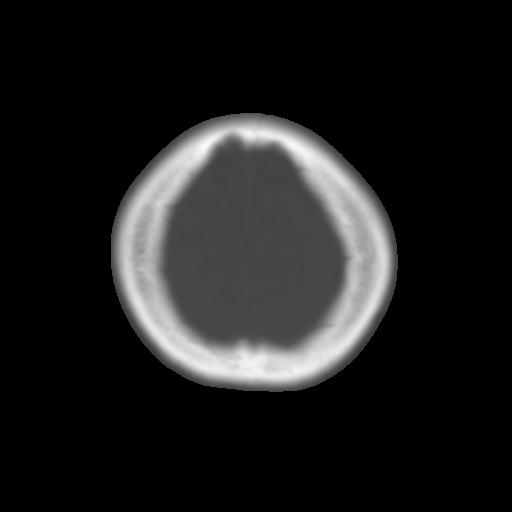
[im 28/30  brain]
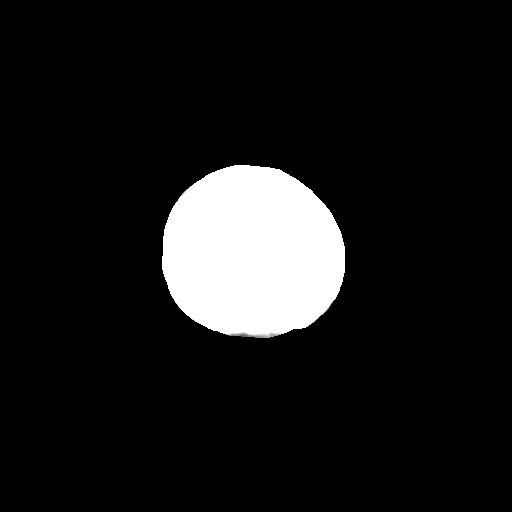

[Series 4: coronal soft tissue · coronal · 0.30mm/px · 3 of 69 slices shown]
[im 23/69  brain]
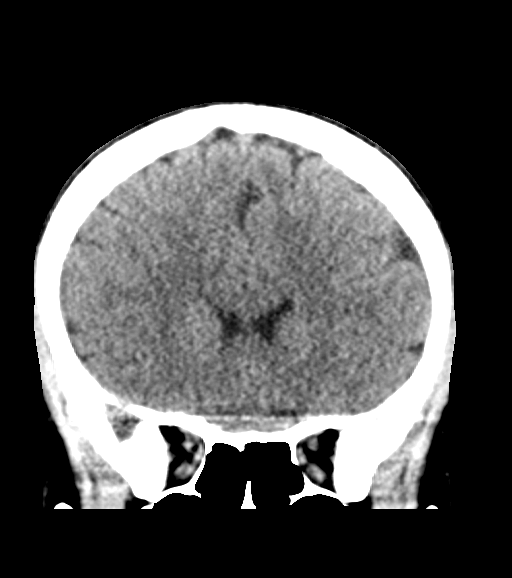
[im 31/69  brain]
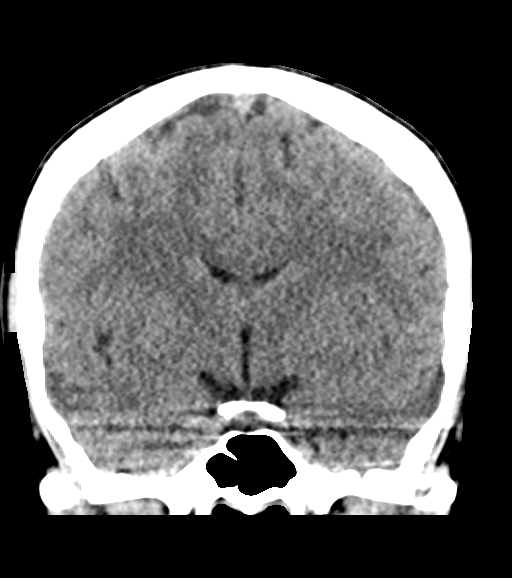
[im 38/69  brain]
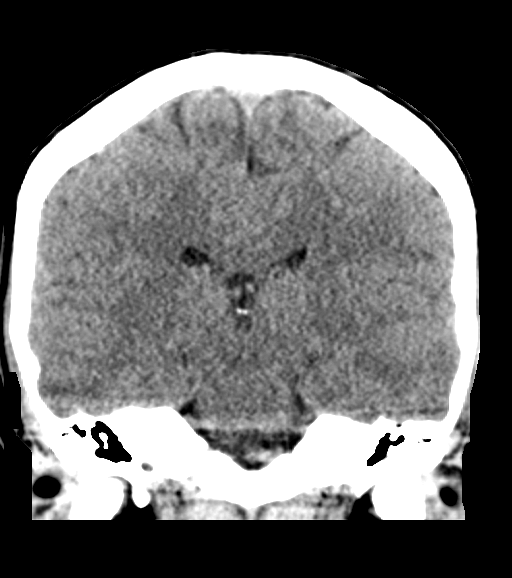

[Series 5: sagittal soft tissue · sagittal · 0.34mm/px · 3 of 57 slices shown]
[im 19/57  brain]
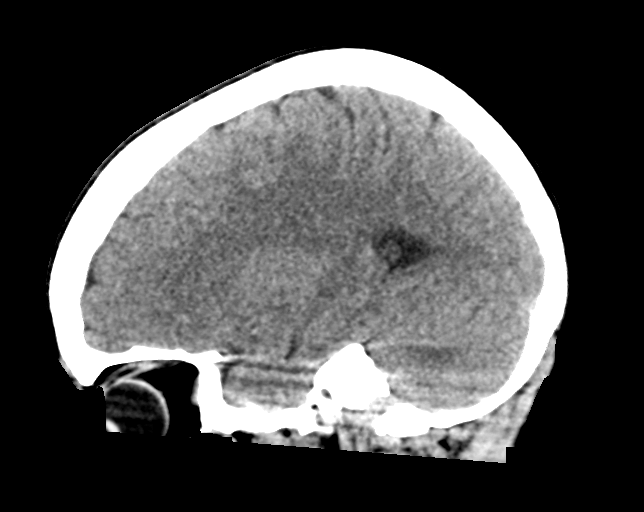
[im 29/57  brain]
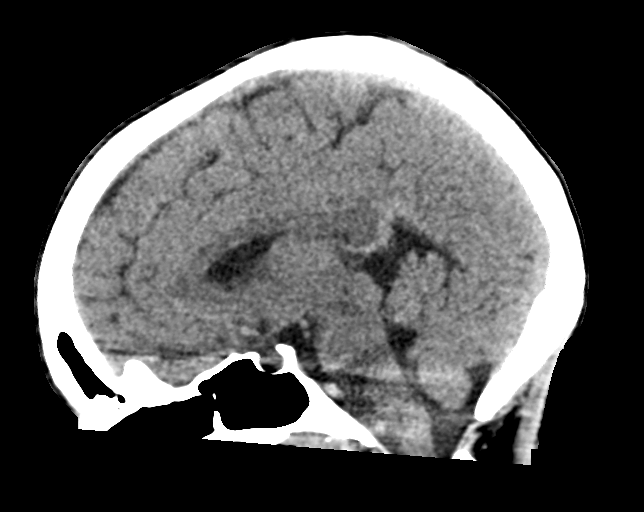
[im 38/57  brain]
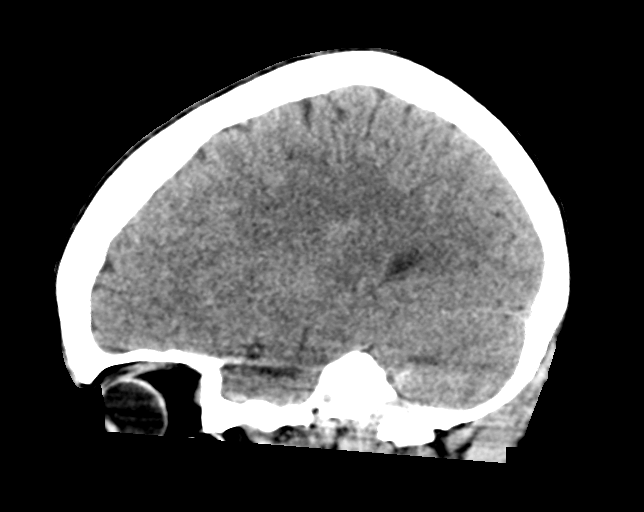

[16 of 47 positions shown; findings below may reference images not displayed]

FINDINGS: Brain: No evidence of acute infarction, hemorrhage, hydrocephalus,
extra-axial collection or mass lesion/mass effect.

Vascular: Normal.

Skull: Intact.

Sinuses/Orbits: Negative.

Other: None.
IMPRESSION: Normal head CT.
